# Patient Record
Sex: Female | Born: 1960 | Race: White | Hispanic: No | Marital: Married | Smoking: Never smoker
Health system: Southern US, Community
[De-identification: ages and names within clinical notes are randomized; demographics above are authoritative.]

## PROBLEM LIST (undated history)

## (undated) DIAGNOSIS — C439 Malignant melanoma of skin, unspecified: Secondary | ICD-10-CM

## (undated) DIAGNOSIS — N809 Endometriosis, unspecified: Secondary | ICD-10-CM

## (undated) DIAGNOSIS — J329 Chronic sinusitis, unspecified: Secondary | ICD-10-CM

## (undated) DIAGNOSIS — E559 Vitamin D deficiency, unspecified: Secondary | ICD-10-CM

## (undated) DIAGNOSIS — G43909 Migraine, unspecified, not intractable, without status migrainosus: Secondary | ICD-10-CM

## (undated) HISTORY — DX: Chronic sinusitis, unspecified: J32.9

## (undated) HISTORY — PX: SHOULDER ARTHROSCOPY W/ ROTATOR CUFF REPAIR: SHX2400

## (undated) HISTORY — PX: JOINT REPLACEMENT: SHX530

## (undated) HISTORY — DX: Vitamin D deficiency, unspecified: E55.9

## (undated) HISTORY — PX: DILATION AND CURETTAGE OF UTERUS: SHX78

## (undated) HISTORY — DX: Malignant melanoma of skin, unspecified: C43.9

## (undated) HISTORY — PX: OTHER SURGICAL HISTORY: SHX169

## (undated) HISTORY — PX: FINGER SURGERY: SHX640

---

## 1989-06-07 HISTORY — PX: TUBAL LIGATION: SHX77

## 2005-03-09 ENCOUNTER — Ambulatory Visit: Payer: Self-pay

## 2005-04-02 ENCOUNTER — Observation Stay: Payer: Self-pay | Admitting: Obstetrics and Gynecology

## 2005-04-23 ENCOUNTER — Ambulatory Visit: Payer: Self-pay | Admitting: Obstetrics and Gynecology

## 2005-06-07 HISTORY — PX: LAPAROSCOPY: SHX197

## 2005-11-29 ENCOUNTER — Inpatient Hospital Stay: Payer: Self-pay | Admitting: Obstetrics and Gynecology

## 2006-04-12 ENCOUNTER — Emergency Department: Payer: Self-pay | Admitting: Emergency Medicine

## 2006-04-19 IMAGING — CT CT ABD-PELV W/ CM
1 of 2 series · 16 of 32 positions shown, 20 images · non-contrast
Comparison: none

REASON FOR EXAM: Abdominal pain
COMMENTS:

[Series 2: abdomen · axial · 0.63mm/px · z∈[-497,-72]mm · 16 of 93 slices shown, 20 images]
[im 4/93  soft-tissue]
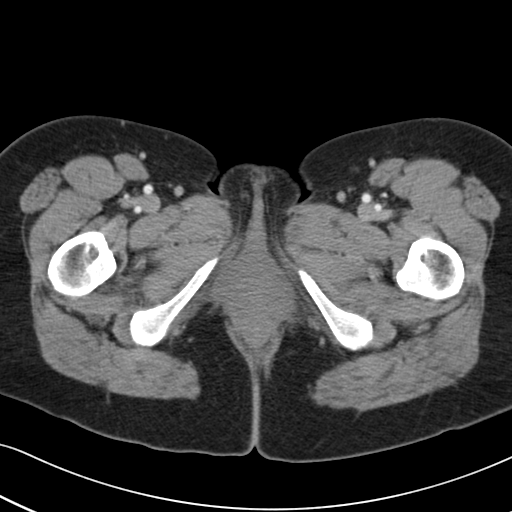
[im 4/93  bone]
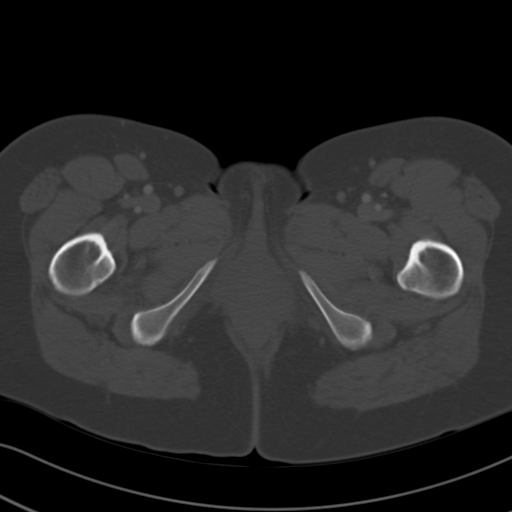
[im 12/93  soft-tissue]
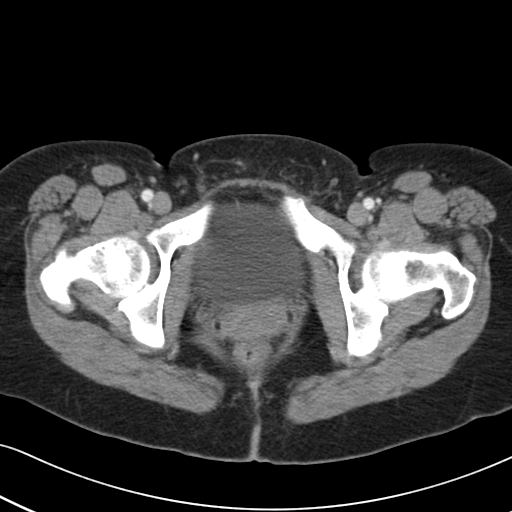
[im 20/93  soft-tissue]
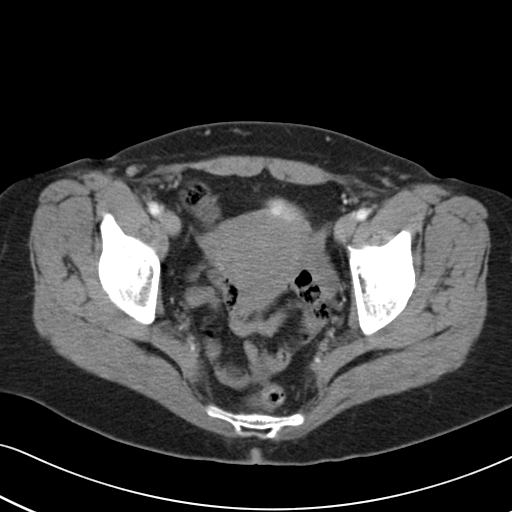
[im 24/93  soft-tissue]
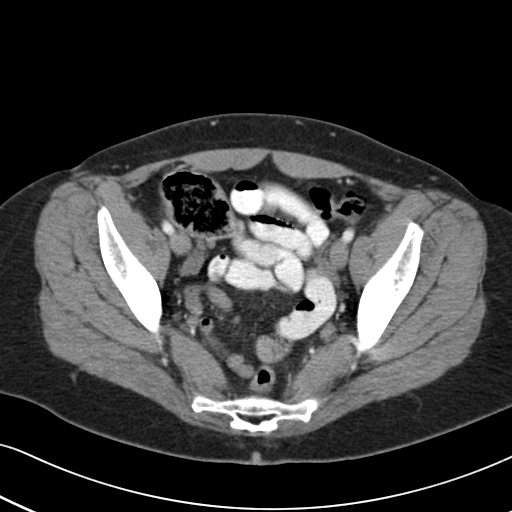
[im 31/93  soft-tissue]
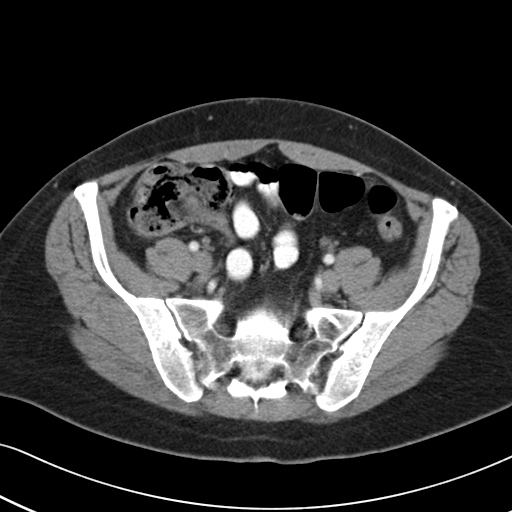
[im 39/93  soft-tissue]
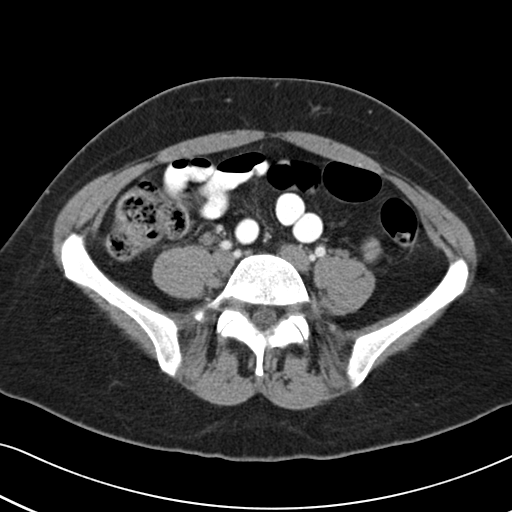
[im 43/93  soft-tissue]
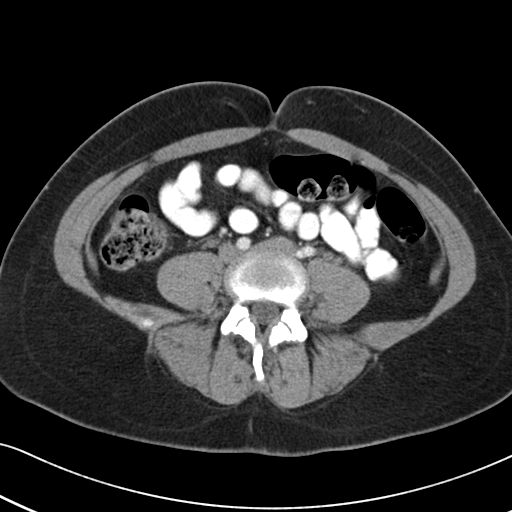
[im 50/93  soft-tissue]
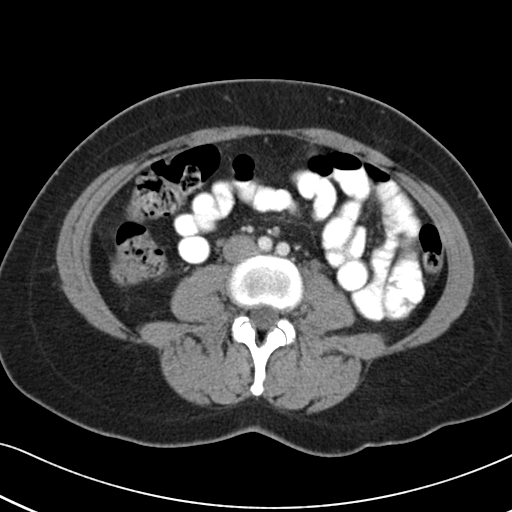
[im 54/93  soft-tissue]
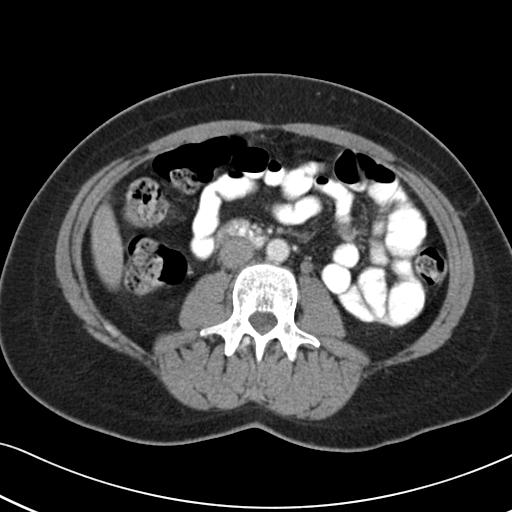
[im 54/93  bone]
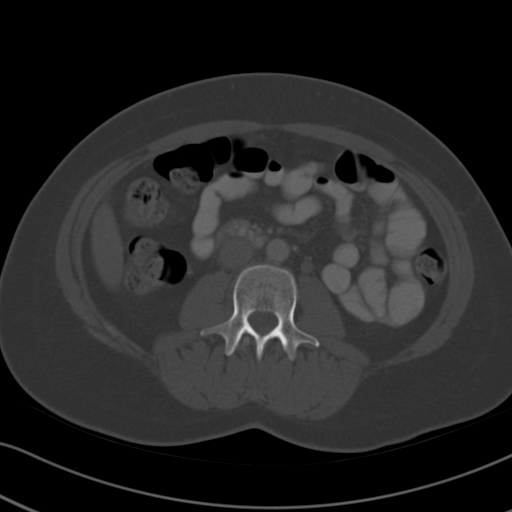
[im 62/93  soft-tissue]
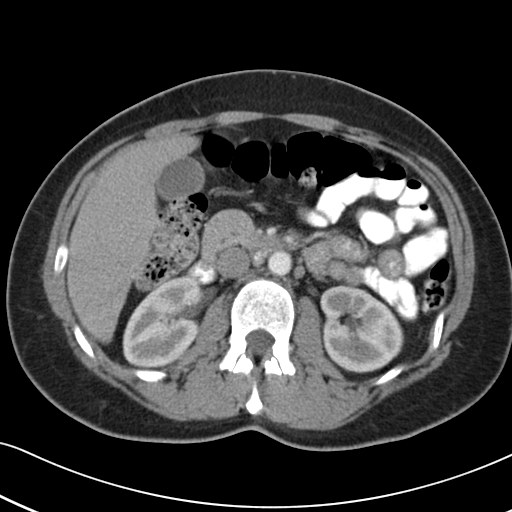
[im 70/93  soft-tissue]
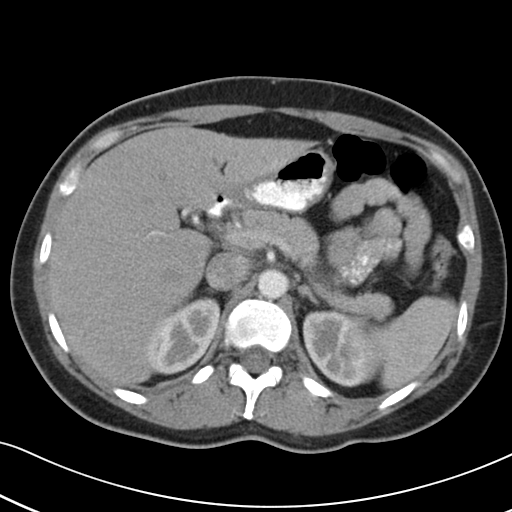
[im 73/93  soft-tissue]
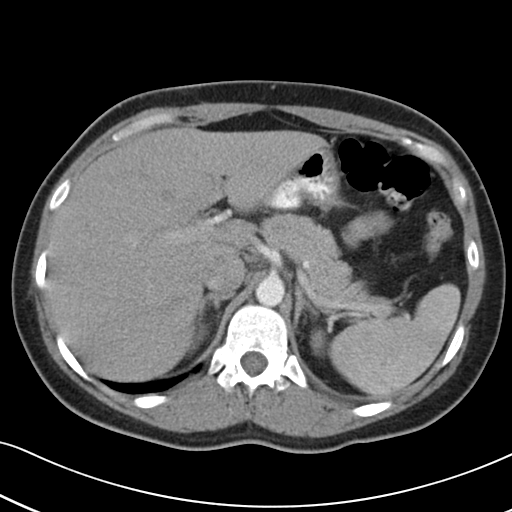
[im 77/93  lung]
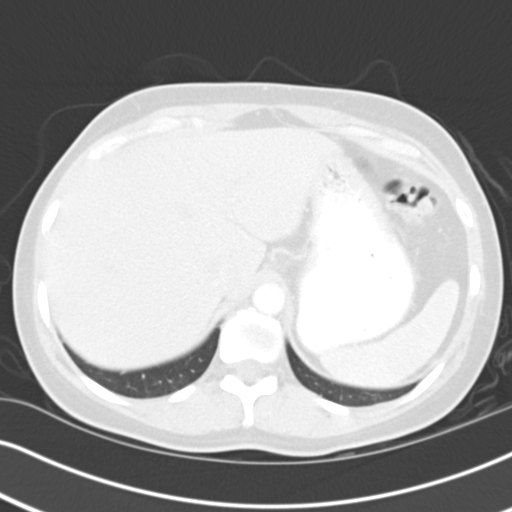
[im 81/93  soft-tissue]
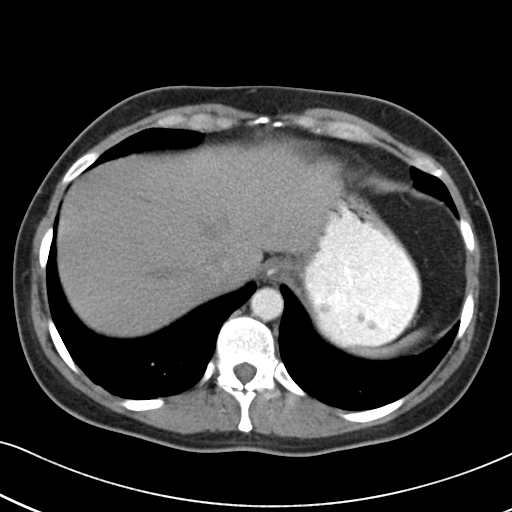
[im 81/93  lung]
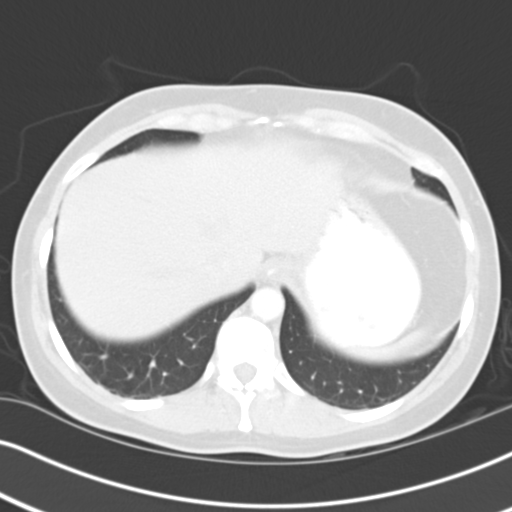
[im 85/93  lung]
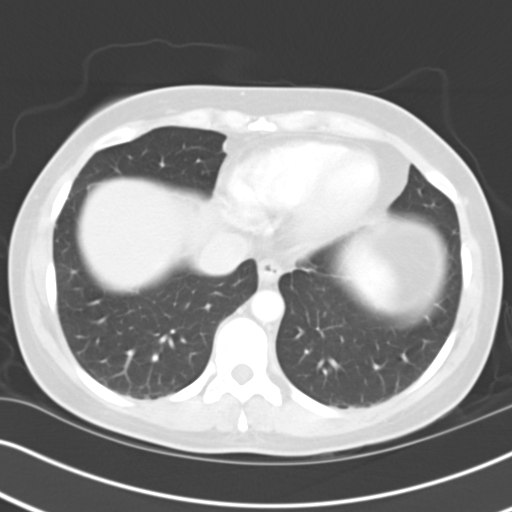
[im 89/93  soft-tissue]
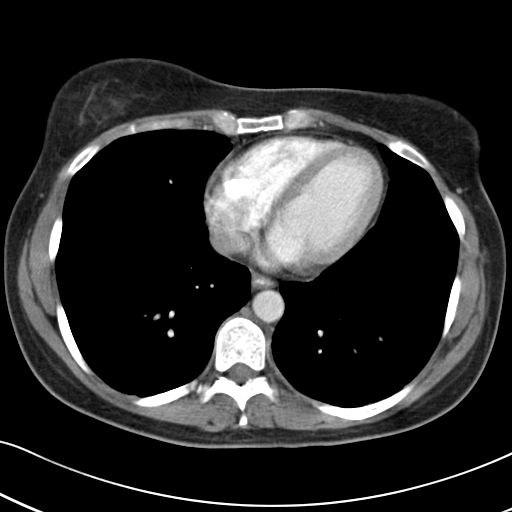
[im 89/93  lung]
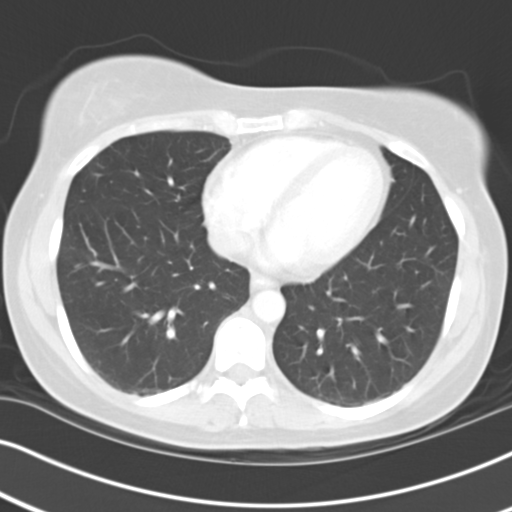

[16 of 32 positions shown; findings below may reference images not displayed]

PROCEDURE:     CT  - CT ABDOMEN / PELVIS  W  - April 02, 2005  [DATE]

RESULT:          Emergent abdominopelvic CT was performed for abdominal
pain.  The exam was originally read by the [HOSPITAL].

No renal or ureteral calculi are identified.  Pelvic phleboliths are noted.
No fluid in the pelvis or abdomen.  No infiltration of the fat is noted to
suggest appendicitis or diverticulitis.  Both kidneys excrete the contrast.
There is no hydronephrosis.  No major organ abnormality is seen.  The lung
bases appear clear with no effusions.
IMPRESSION: No significant abnormality is noted on the
abdominopelvic CT.

## 2006-05-04 ENCOUNTER — Ambulatory Visit: Payer: Self-pay | Admitting: Obstetrics and Gynecology

## 2006-05-25 ENCOUNTER — Ambulatory Visit: Payer: Self-pay | Admitting: Obstetrics and Gynecology

## 2006-06-07 HISTORY — PX: ABDOMINAL HYSTERECTOMY: SHX81

## 2007-06-21 ENCOUNTER — Ambulatory Visit: Payer: Self-pay

## 2007-09-09 ENCOUNTER — Inpatient Hospital Stay: Payer: Self-pay | Admitting: Internal Medicine

## 2008-06-21 ENCOUNTER — Ambulatory Visit: Payer: Self-pay | Admitting: Obstetrics and Gynecology

## 2009-06-23 ENCOUNTER — Ambulatory Visit: Payer: Self-pay

## 2010-05-14 ENCOUNTER — Emergency Department: Payer: Self-pay | Admitting: Emergency Medicine

## 2010-05-15 ENCOUNTER — Emergency Department: Payer: Self-pay | Admitting: Emergency Medicine

## 2010-07-14 ENCOUNTER — Ambulatory Visit: Payer: Self-pay

## 2011-07-20 ENCOUNTER — Ambulatory Visit: Payer: Self-pay

## 2011-10-04 ENCOUNTER — Emergency Department: Payer: Self-pay | Admitting: *Deleted

## 2011-10-04 LAB — COMPREHENSIVE METABOLIC PANEL
Albumin: 4.1 g/dL (ref 3.4–5.0)
Anion Gap: 9 (ref 7–16)
Bilirubin,Total: 0.9 mg/dL (ref 0.2–1.0)
Calcium, Total: 9.1 mg/dL (ref 8.5–10.1)
EGFR (African American): >60
Glucose: 95 mg/dL (ref 65–99)
Osmolality: 277 (ref 275–301)
Potassium: 4 mmol/L (ref 3.5–5.1)
SGOT(AST): 37 U/L (ref 15–37)
SGPT (ALT): 29 U/L
Total Protein: 8.2 g/dL (ref 6.4–8.2)

## 2011-10-04 LAB — URINALYSIS, COMPLETE
Bacteria: NONE SEEN
Bilirubin,UR: NEGATIVE
Blood: NEGATIVE
Glucose,UR: NEGATIVE mg/dL (ref 0–75)
Nitrite: NEGATIVE
Ph: 5 (ref 4.5–8.0)
Protein: NEGATIVE
RBC,UR: 1 /HPF (ref 0–5)
Specific Gravity: 1.024 (ref 1.003–1.030)
WBC UR: 2 /HPF (ref 0–5)

## 2011-10-04 LAB — CBC
HCT: 45.2 % (ref 35.0–47.0)
MCH: 30.1 pg (ref 26.0–34.0)
MCHC: 33.7 g/dL (ref 32.0–36.0)
Platelet: 324 10*3/uL (ref 150–440)
RBC: 5.08 10*6/uL (ref 3.80–5.20)
RDW: 12.7 % (ref 11.5–14.5)
WBC: 13.5 10*3/uL — ABNORMAL HIGH (ref 3.6–11.0)

## 2011-10-04 LAB — LIPASE, BLOOD: Lipase: 117 U/L (ref 73–393)

## 2012-05-22 ENCOUNTER — Emergency Department: Payer: Self-pay | Admitting: Emergency Medicine

## 2012-05-22 LAB — COMPREHENSIVE METABOLIC PANEL
Albumin: 3.7 g/dL (ref 3.4–5.0)
Alkaline Phosphatase: 128 U/L (ref 50–136)
Anion Gap: 10 (ref 7–16)
BUN: 17 mg/dL (ref 7–18)
Bilirubin,Total: 0.6 mg/dL (ref 0.2–1.0)
Calcium, Total: 9 mg/dL (ref 8.5–10.1)
Co2: 20 mmol/L — ABNORMAL LOW (ref 21–32)
EGFR (African American): >60
EGFR (Non-African Amer.): >60
Glucose: 103 mg/dL — ABNORMAL HIGH (ref 65–99)
Osmolality: 277 (ref 275–301)
Potassium: 3.9 mmol/L (ref 3.5–5.1)
SGOT(AST): 27 U/L (ref 15–37)
Sodium: 138 mmol/L (ref 136–145)
Total Protein: 8 g/dL (ref 6.4–8.2)

## 2012-05-22 LAB — URINALYSIS, COMPLETE
Bacteria: NONE SEEN
Bilirubin,UR: NEGATIVE
Glucose,UR: NEGATIVE mg/dL (ref 0–75)
Leukocyte Esterase: NEGATIVE
Nitrite: NEGATIVE
RBC,UR: <1 /HPF (ref 0–5)
Specific Gravity: 1.021 (ref 1.003–1.030)
Squamous Epithelial: 4
WBC UR: 1 /HPF (ref 0–5)

## 2012-05-22 LAB — CBC
HGB: 14.2 g/dL (ref 12.0–16.0)
MCH: 29.4 pg (ref 26.0–34.0)
MCV: 89 fL (ref 80–100)
Platelet: 369 10*3/uL (ref 150–440)
RBC: 4.85 10*6/uL (ref 3.80–5.20)
WBC: 13.8 10*3/uL — ABNORMAL HIGH (ref 3.6–11.0)

## 2014-04-10 ENCOUNTER — Ambulatory Visit: Payer: Self-pay | Admitting: Family Medicine

## 2014-04-26 ENCOUNTER — Ambulatory Visit: Payer: Self-pay | Admitting: Gastroenterology

## 2014-05-17 ENCOUNTER — Ambulatory Visit: Payer: Self-pay | Admitting: Urgent Care

## 2014-09-12 ENCOUNTER — Ambulatory Visit
Admit: 2014-09-12 | Disposition: A | Discharge status: Home / Self Care | Payer: Self-pay | Admitting: Obstetrics and Gynecology

## 2015-05-08 ENCOUNTER — Telehealth: Payer: Self-pay | Admitting: Obstetrics and Gynecology

## 2015-05-08 NOTE — Telephone Encounter (Signed)
Pt called and wanted to know if you could call her in a z-pack she said that you have done it before, she has a sinus infection, she uses [facility].

## 2015-05-09 ENCOUNTER — Other Ambulatory Visit: Payer: Self-pay | Admitting: Obstetrics and Gynecology

## 2015-05-09 MED ORDER — AZITHROMYCIN 250 MG PO TABS: ORAL_TABLET | ORAL | Status: DC

## 2015-05-09 NOTE — Telephone Encounter (Signed)
Please let her know it was sent in today 

## 2015-05-12 NOTE — Telephone Encounter (Signed)
Pt had gotten rx on Friday and is feeling much better.

## 2015-05-23 ENCOUNTER — Emergency Department
Admission: EM | Admit: 2015-05-23 | Discharge: 2015-05-24 | Disposition: A | Discharge status: Home / Self Care | Payer: 59 | Attending: Emergency Medicine | Admitting: Emergency Medicine

## 2015-05-23 ENCOUNTER — Emergency Department: Payer: 59

## 2015-05-23 ENCOUNTER — Encounter: Payer: Self-pay | Admitting: Emergency Medicine

## 2015-05-23 DIAGNOSIS — R1013 Epigastric pain: Secondary | ICD-10-CM | POA: Diagnosis not present

## 2015-05-23 DIAGNOSIS — R0602 Shortness of breath: Secondary | ICD-10-CM | POA: Diagnosis not present

## 2015-05-23 DIAGNOSIS — R079 Chest pain, unspecified: Secondary | ICD-10-CM | POA: Diagnosis present

## 2015-05-23 HISTORY — DX: Endometriosis, unspecified: N80.9

## 2015-05-23 HISTORY — DX: Migraine, unspecified, not intractable, without status migrainosus: G43.909

## 2015-05-23 LAB — CBC
HEMATOCRIT: 42.1 % (ref 35.0–47.0)
HEMOGLOBIN: 13.6 g/dL (ref 12.0–16.0)
MCH: 28.7 pg (ref 26.0–34.0)
MCHC: 32.4 g/dL (ref 32.0–36.0)
MCV: 88.6 fL (ref 80.0–100.0)
Platelets: 318 10*3/uL (ref 150–440)
RBC: 4.75 MIL/uL (ref 3.80–5.20)
RDW: 12.9 % (ref 11.5–14.5)
WBC: 9.3 10*3/uL (ref 3.6–11.0)

## 2015-05-23 NOTE — ED Notes (Signed)
Pt states at 2200 this pm began to experience burning to central chest that extended to throat and upper abd. Pt denies dizziness, states did feel shob.

## 2015-05-23 NOTE — ED Notes (Signed)
Pt assisted up to restroom

## 2015-05-24 ENCOUNTER — Emergency Department: Payer: 59

## 2015-05-24 LAB — COMPREHENSIVE METABOLIC PANEL
ALK PHOS: 112 U/L (ref 38–126)
ALT: 25 U/L (ref 14–54)
AST: 31 U/L (ref 15–41)
Albumin: 4.1 g/dL (ref 3.5–5.0)
Anion gap: 5 (ref 5–15)
BUN: 24 mg/dL — AB (ref 6–20)
CALCIUM: 9.1 mg/dL (ref 8.9–10.3)
CHLORIDE: 107 mmol/L (ref 101–111)
CO2: 27 mmol/L (ref 22–32)
CREATININE: 0.8 mg/dL (ref 0.44–1.00)
Glucose, Bld: 99 mg/dL (ref 65–99)
Potassium: 4.1 mmol/L (ref 3.5–5.1)
Sodium: 139 mmol/L (ref 135–145)
Total Bilirubin: 1.2 mg/dL (ref 0.3–1.2)
Total Protein: 7.5 g/dL (ref 6.5–8.1)

## 2015-05-24 LAB — LIPASE, BLOOD: Lipase: 32 U/L (ref 11–51)

## 2015-05-24 LAB — TROPONIN I: Troponin I: <0.03 ng/mL (ref <0.031)

## 2015-05-24 MED ORDER — SUCRALFATE 1 G PO TABS: 1.0000 g | ORAL_TABLET | Freq: Two times a day (BID) | ORAL | Status: DC

## 2015-05-24 NOTE — ED Notes (Signed)
Pt returned from ultrasound. Pt states pain improved, water provided with md consent.

## 2015-05-24 NOTE — ED Notes (Signed)
Repeat troponin drawn and sent to lab.

## 2015-05-24 NOTE — ED Provider Notes (Signed)
[facility] Provider Note  ____________________________________________  Time seen: Approximately 0017 AM  I have reviewed the triage vital signs and the nursing notes.   HISTORY  Chief Complaint Chest Pain    HPI Kristie Hall is a 54 y.o. female who comes into the hospital tonight with chest burning. The patient reports that the pain started 10 PM. She reports that she started hurting bad and burning from her stomach for her throat. The patient reports that she thinks this is due to a sick gallbladder. The patient has had intermittent epigastric pain on and off for the past year. Her stomach gets tightapproximate 10 minutes after eating and lasts for a short period of time. The patient reports that this is the first time that she's had the burning in her chest. The patient has had an HIDA scan done as well as an endoscopy and has been told that her gallbladder is fine but she feels that that may be the cause of her symptoms. The patient reports that currently her pain is gone but she did have some shortness of breath when she experienced the chest burning. She denies any sweats nausea or vomiting. The patient reports that she eats healthy and does not eat lots of greasy or fatty foods.   Past Medical History  Diagnosis Date  . Migraines   . Endometriosis     There are no active problems to display for this patient.   Past Surgical History  Procedure Laterality Date  . Endrometrial surgery      Current Outpatient Rx  Name  Route  Sig  Dispense  Refill  . sucralfate (CARAFATE) 1 G tablet   Oral   Take 1 tablet (1 g total) by mouth 2 (two) times daily.   20 tablet   0     Allergies Imitrex  History reviewed. No pertinent family history.  Social History Social History  Substance Use Topics  . Smoking status: Never Smoker   . Smokeless tobacco: Never Used  . Alcohol Use: Yes    Review of Systems Constitutional: No  fever/chills Eyes: No visual changes. ENT: No sore throat. Cardiovascular:  chest pain. Respiratory:  shortness of breath. Gastrointestinal: No abdominal pain.  No nausea, no vomiting.  No diarrhea.  No constipation. Genitourinary: Negative for dysuria. Musculoskeletal: Negative for back pain. Skin: Negative for rash. Neurological: Negative for headaches, focal weakness or numbness.  10-point ROS otherwise negative.  ____________________________________________   PHYSICAL EXAM:  VITAL SIGNS: ED Triage Vitals  Enc Vitals Group     BP 05/23/15 2318 143/87 mmHg     Pulse Rate 05/23/15 2318 74     Resp 05/23/15 2318 20     Temp 05/23/15 2318 98.2 F (36.8 C)     Temp src --      SpO2 05/23/15 2318 100 %     Weight 05/23/15 2318 167 lb (75.751 kg)     Height 05/23/15 2318 5\' 7"  (1.702 m)     Head Cir --      Peak Flow --      Pain Score 05/23/15 2319 1     Pain Loc --      Pain Edu? --      Excl. in [place]? --     Constitutional: Alert and oriented. Well appearing and in no acute distress. Eyes: Conjunctivae are normal. PERRL. EOMI. Head: Atraumatic. Nose: No congestion/rhinnorhea. Mouth/Throat: Mucous membranes are moist.  Oropharynx non-erythematous. Cardiovascular: Normal rate, regular  rhythm. Grossly normal heart sounds.  Good peripheral circulation. Respiratory: Normal respiratory effort.  No retractions. Lungs CTAB. Gastrointestinal: Soft and nontender. No distention. Positive bowel sounds Musculoskeletal: No lower extremity tenderness nor edema.   Neurologic:  Normal speech and language.  Skin:  Skin is warm, dry and intact. No rash noted. Psychiatric: Mood and affect are normal.  ____________________________________________   LABS (all labs ordered are listed, but only abnormal results are displayed)  Labs Reviewed  COMPREHENSIVE METABOLIC PANEL - Abnormal; Notable for the following:    BUN 24 (*)    All other components within normal limits  CBC  TROPONIN  I  LIPASE, BLOOD  TROPONIN I   ____________________________________________  EKG  ED ECG REPORT I, Loney Hering, the attending physician, personally viewed and interpreted this ECG.   Date: 05/23/2015  EKG Time: 2320  Rate: 73  Rhythm: normal sinus rhythm  Axis: Normal  Intervals:none  ST&T Change: None  ____________________________________________  RADIOLOGY  Chest x-ray: Mild atelectasis within the lingula, no other active cardiopulmonary disease Ultrasound: Normal sonographic appearance of the gallbladder and biliary tree.  ____________________________________________   PROCEDURES  Procedure(s) performed: None  Critical Care performed: No  ____________________________________________   INITIAL IMPRESSION / ASSESSMENT AND PLAN / ED COURSE  Pertinent labs & imaging results that were available during my care of the patient were reviewed by me and considered in my medical decision making (see chart for details).  This is a 54 year old female who comes in today with some chest burning that started around 10 PM. The patient reports that she feels as though this is her gallbladder although she's had her gallbladder evaluated in the past. At this time the patient's symptoms have resolved. I sent the patient for an ultrasound and it does not show any gallbladder disease. The patient's chest x-ray shows atelectasis with no distinctive pneumonia and her blood work is unremarkable. I informed the patient that should she want her gallbladder removed she should consult a surgeon who can make that decision. At this time we are waiting the results of the second troponin and will disposition the patient at that time.  ----------------------------------------- 3:48 AM on 05/24/2015 -----------------------------------------  The patient's repeat troponin is unremarkable. I will send the patient home with some Carafate and have her follow-up with GI as well as surgery to  determine if she needs a further gallbladder evaluation. Patient at this time is pain free and in no acute distress. ____________________________________________   FINAL CLINICAL IMPRESSION(S) / ED DIAGNOSES  Final diagnoses:  Epigastric pain  Chest pain, unspecified chest pain type      Loney Hering, MD 05/24/15 [phone]

## 2015-05-24 NOTE — Discharge Instructions (Signed)
Nonspecific Chest Pain  Chest pain can be caused by many different conditions. There is always a chance that your pain could be related to something serious, such as a heart attack or a blood clot in your lungs. Chest pain can also be caused by conditions that are not life-threatening. If you have chest pain, it is very important to follow up with your health care provider. CAUSES  Chest pain can be caused by:  Heartburn.  Pneumonia or bronchitis.  Anxiety or stress.  Inflammation around your heart (pericarditis) or lung (pleuritis or pleurisy).  A blood clot in your lung.  A collapsed lung (pneumothorax). It can develop suddenly on its own (spontaneous pneumothorax) or from trauma to the chest.  Shingles infection (varicella-zoster virus).  Heart attack.  Damage to the bones, muscles, and cartilage that make up your chest wall. This can include:  Bruised bones due to injury.  Strained muscles or cartilage due to frequent or repeated coughing or overwork.  Fracture to one or more ribs.  Sore cartilage due to inflammation (costochondritis). RISK FACTORS  Risk factors for chest pain may include:  Activities that increase your risk for trauma or injury to your chest.  Respiratory infections or conditions that cause frequent coughing.  Medical conditions or overeating that can cause heartburn.  Heart disease or family history of heart disease.  Conditions or health behaviors that increase your risk of developing a blood clot.  Having had chicken pox (varicella zoster). SIGNS AND SYMPTOMS Chest pain can feel like:  Burning or tingling on the surface of your chest or deep in your chest.  Crushing, pressure, aching, or squeezing pain.  Dull or sharp pain that is worse when you move, cough, or take a deep breath.  Pain that is also felt in your back, neck, shoulder, or arm, or pain that spreads to any of these areas. Your chest pain may come and go, or it may stay  constant. DIAGNOSIS Lab tests or other studies may be needed to find the cause of your pain. Your health care provider may have you take a test called an ambulatory ECG (electrocardiogram). An ECG records your heartbeat patterns at the time the test is performed. You may also have other tests, such as:  Transthoracic echocardiogram (TTE). During echocardiography, sound waves are used to create a picture of all of the heart structures and to look at how blood flows through your heart.  Transesophageal echocardiogram (TEE).This is a more advanced imaging test that obtains images from inside your body. It allows your health care provider to see your heart in finer detail.  Cardiac monitoring. This allows your health care provider to monitor your heart rate and rhythm in real time.  Holter monitor. This is a portable device that records your heartbeat and can help to diagnose abnormal heartbeats. It allows your health care provider to track your heart activity for several days, if needed.  Stress tests. These can be done through exercise or by taking medicine that makes your heart beat more quickly.  Blood tests.  Imaging tests. TREATMENT  Your treatment depends on what is causing your chest pain. Treatment may include:  Medicines. These may include:  Acid blockers for heartburn.  Anti-inflammatory medicine.  Pain medicine for inflammatory conditions.  Antibiotic medicine, if an infection is present.  Medicines to dissolve blood clots.  Medicines to treat coronary artery disease.  Supportive care for conditions that do not require medicines. This may include:  Resting.  Applying heat  or cold packs to injured areas.  Limiting activities until pain decreases. HOME CARE INSTRUCTIONS  If you were prescribed an antibiotic medicine, finish it all even if you start to feel better.  Avoid any activities that bring on chest pain.  Do not use any tobacco products, including  cigarettes, chewing tobacco, or electronic cigarettes. If you need help quitting, ask your health care provider.  Do not drink alcohol.  Take medicines only as directed by your health care provider.  Keep all follow-up visits as directed by your health care provider. This is important. This includes any further testing if your chest pain does not go away.  If heartburn is the cause for your chest pain, you may be told to keep your head raised (elevated) while sleeping. This reduces the chance that acid will go from your stomach into your esophagus.  Make lifestyle changes as directed by your health care provider. These may include:  Getting regular exercise. Ask your health care provider to suggest some activities that are safe for you.  Eating a heart-healthy diet. A registered dietitian can help you to learn healthy eating options.  Maintaining a healthy weight.  Managing diabetes, if necessary.  Reducing stress. SEEK MEDICAL CARE IF:  Your chest pain does not go away after treatment.  You have a rash with blisters on your chest.  You have a fever. SEEK IMMEDIATE MEDICAL CARE IF:   Your chest pain is worse.  You have an increasing cough, or you cough up blood.  You have severe abdominal pain.  You have severe weakness.  You faint.  You have chills.  You have sudden, unexplained chest discomfort.  You have sudden, unexplained discomfort in your arms, back, neck, or jaw.  You have shortness of breath at any time.  You suddenly start to sweat, or your skin gets clammy.  You feel nauseous or you vomit.  You suddenly feel light-headed or dizzy.  Your heart begins to beat quickly, or it feels like it is skipping beats. These symptoms may represent a serious problem that is an emergency. Do not wait to see if the symptoms will go away. Get medical help right away. Call your local emergency services (911 in the [place] Do not drive yourself to the hospital.   This  information is not intended to replace advice given to you by your health care provider. Make sure you discuss any questions you have with your health care provider.   Document Released: 03/03/2005 Document Revised: 06/14/2014 Document Reviewed: 12/28/2013 Elsevier Interactive Patient Education 2016 [employer]  Abdominal Pain, Adult Many things can cause abdominal pain. Usually, abdominal pain is not caused by a disease and will improve without treatment. It can often be observed and treated at home. Your health care provider will do a physical exam and possibly order blood tests and X-rays to help determine the seriousness of your pain. However, in many cases, more time must pass before a clear cause of the pain can be found. Before that point, your health care provider may not know if you need more testing or further treatment. HOME CARE INSTRUCTIONS Monitor your abdominal pain for any changes. The following actions may help to alleviate any discomfort you are experiencing:  Only take over-the-counter or prescription medicines as directed by your health care provider.  Do not take laxatives unless directed to do so by your health care provider.  Try a clear liquid diet (broth, tea, or water) as directed by your health care  provider. Slowly move to a bland diet as tolerated. SEEK MEDICAL CARE IF:  You have unexplained abdominal pain.  You have abdominal pain associated with nausea or diarrhea.  You have pain when you urinate or have a bowel movement.  You experience abdominal pain that wakes you in the night.  You have abdominal pain that is worsened or improved by eating food.  You have abdominal pain that is worsened with eating fatty foods.  You have a fever. SEEK IMMEDIATE MEDICAL CARE IF:  Your pain does not go away within 2 hours.  You keep throwing up (vomiting).  Your pain is felt only in portions of the abdomen, such as the right side or the left lower portion of the  abdomen.  You pass bloody or black tarry stools. MAKE SURE YOU:  Understand these instructions.  Will watch your condition.  Will get help right away if you are not doing well or get worse.   This information is not intended to replace advice given to you by your health care provider. Make sure you discuss any questions you have with your health care provider.   Document Released: 03/03/2005 Document Revised: 02/12/2015 Document Reviewed: 01/31/2013 Elsevier Interactive Patient Education [employer].

## 2015-05-24 NOTE — ED Notes (Signed)
Explanation of repeat troponin and time frame provided to pt and spouse. Pt verbalizes understanding. Extra pillow and po fluids provided to pt's spouse.

## 2015-05-24 NOTE — ED Notes (Signed)
Patient transported to Ultrasound 

## 2015-08-18 ENCOUNTER — Encounter: Payer: Self-pay | Admitting: *Deleted

## 2015-08-20 ENCOUNTER — Encounter: Payer: Self-pay | Admitting: Obstetrics and Gynecology

## 2015-08-20 ENCOUNTER — Other Ambulatory Visit: Payer: Self-pay | Admitting: Obstetrics and Gynecology

## 2015-08-20 ENCOUNTER — Telehealth: Payer: Self-pay | Admitting: Obstetrics and Gynecology

## 2015-08-20 DIAGNOSIS — Z1231 Encounter for screening mammogram for malignant neoplasm of breast: Secondary | ICD-10-CM

## 2015-08-20 NOTE — Telephone Encounter (Signed)
Kristie Hall has a virus and had to cancel her AE, i couldn't reschedule until may, she wanted to know if you could go ahead and put her order in for her mammo so she can have that done when she comes? Let me know and ill call her.

## 2015-08-20 NOTE — Telephone Encounter (Signed)
Order placed

## 2015-09-19 DIAGNOSIS — M8550 Aneurysmal bone cyst, unspecified site: Secondary | ICD-10-CM | POA: Insufficient documentation

## 2015-09-19 DIAGNOSIS — M5459 Other low back pain: Secondary | ICD-10-CM | POA: Insufficient documentation

## 2015-09-19 DIAGNOSIS — M6530 Trigger finger, unspecified finger: Secondary | ICD-10-CM | POA: Insufficient documentation

## 2015-10-07 ENCOUNTER — Ambulatory Visit
Admission: RE | Admit: 2015-10-07 | Discharge: 2015-10-07 | Disposition: A | Discharge status: Home / Self Care | Payer: 59 | Source: Ambulatory Visit | Attending: Obstetrics and Gynecology | Admitting: Obstetrics and Gynecology

## 2015-10-07 ENCOUNTER — Ambulatory Visit (INDEPENDENT_AMBULATORY_CARE_PROVIDER_SITE_OTHER): Payer: 59 | Admitting: Obstetrics and Gynecology

## 2015-10-07 ENCOUNTER — Encounter: Payer: Self-pay | Admitting: Obstetrics and Gynecology

## 2015-10-07 VITALS — BP 113/78 | HR 79 | Ht 67.0 in | Wt 158.5 lb

## 2015-10-07 DIAGNOSIS — Z1231 Encounter for screening mammogram for malignant neoplasm of breast: Secondary | ICD-10-CM

## 2015-10-07 DIAGNOSIS — E559 Vitamin D deficiency, unspecified: Secondary | ICD-10-CM

## 2015-10-07 DIAGNOSIS — Z01419 Encounter for gynecological examination (general) (routine) without abnormal findings: Secondary | ICD-10-CM | POA: Diagnosis not present

## 2015-10-07 DIAGNOSIS — G479 Sleep disorder, unspecified: Secondary | ICD-10-CM

## 2015-10-07 DIAGNOSIS — G4720 Circadian rhythm sleep disorder, unspecified type: Secondary | ICD-10-CM

## 2015-10-07 MED ORDER — ZOLPIDEM TARTRATE ER 6.25 MG PO TBCR: 6.2500 mg | EXTENDED_RELEASE_TABLET | Freq: Every evening | ORAL | Status: DC | PRN

## 2015-10-07 NOTE — Patient Instructions (Addendum)
Place annual gynecologic exam patient instructions here.  Thank you for enrolling in [facility]. Please follow the instructions below to securely access your online medical record. MyChart allows you to send messages to your doctor, view your test results, manage appointments, and more.   How Do I Sign Up? 1. In your Internet browser, go to [employer] and enter [email or web address]. 2. Click on the Sign Up Now link in the Sign In box. You will see the New Member Sign Up page. 3. Enter your MyChart Access Code exactly as it appears below. You will not need to use this code after you've completed the sign-up process. If you do not sign up before the expiration date, you must request a new code.  MyChart Access Code: [email or web address] Expires: 10/19/2015  8:13 AM  4. Enter your Social Security Number ([id number]) and Date of Birth (mm/dd/yyyy) as indicated and click Submit. You will be taken to the next sign-up page. 5. Create a MyChart ID. This will be your MyChart login ID and cannot be changed, so think of one that is secure and easy to remember. 6. Create a MyChart password. You can change your password at any time. 7. Enter your Password Reset Question and Answer. This can be used at a later time if you forget your password.  8. Enter your e-mail address. You will receive e-mail notification when new information is available in [place]. 9. Click Sign Up. You can now view your medical record.   Additional Information Remember, MyChart is NOT to be used for urgent needs. For medical emergencies, dial 911.   Zolpidem extended-release tablets What is this medicine? ZOLPIDEM (zole PI dem) is used to treat insomnia. This medicine helps you to fall asleep and sleep through the night. This medicine may be used for other purposes; ask your health care provider or pharmacist if you have questions. What should I tell my health care provider before I take this medicine? They need to  know if you have any of these conditions: -depression -history of drug abuse or addiction -if you often drink alcohol -liver disease -lung or breathing disease -myasthenia gravis -sleep apnea -suicidal thoughts, plans, or attempt; a previous suicide attempt by you or a family member -an unusual or allergic reaction to zolpidem, other medicines, foods, dyes, or preservatives -pregnant or trying to get pregnant -breast-feeding How should I use this medicine? Take this medicine by mouth with a glass of water. Follow the directions on the prescription label. Do not crush, split, or chew the tablet before swallowing. It is better to take this medicine on an empty stomach and only when you are ready for bed. Do not take your medicine more often than directed. If you have been taking this medicine for several weeks and suddenly stop taking it, you may get unpleasant withdrawal symptoms. Your doctor or health care professional may want to gradually reduce the dose. Do not stop taking this medicine on your own. Always follow your doctor or health care professional's advice. A special MedGuide will be given to you by the pharmacist with each prescription and refill. Be sure to read this information carefully each time. Talk to your pediatrician regarding the use of this medicine in children. Special care may be needed. Overdosage: If you think you have taken too much of this medicine contact a poison control center or emergency room at once. NOTE: This medicine is only for you. Do not share this medicine with others. What if I miss a dose?  This does not apply. This medicine should only be taken immediately before going to sleep. Do not take double or extra doses. What may interact with this medicine? -alcohol -antihistamines for allergy, cough and cold -certain medicines for anxiety or sleep -certain medicines for depression, like amitriptyline, fluoxetine, sertraline -certain medicines for fungal  infections like ketoconazole and itraconazole -certain medicines for seizures like phenobarbital, primidone -ciprofloxacin -dietary supplements for sleep, like valerian or kava kava -general anesthetics like halothane, isoflurane, methoxyflurane, propofol -local anesthetics like lidocaine, pramoxine, tetracaine -medicines that relax muscles for surgery -narcotic medicines for pain -phenothiazines like chlorpromazine, mesoridazine, prochlorperazine, thioridazine -rifampin This list may not describe all possible interactions. Give your health care provider a list of all the medicines, herbs, non-prescription drugs, or dietary supplements you use. Also tell them if you smoke, drink alcohol, or use illegal drugs. Some items may interact with your medicine. What should I watch for while using this medicine? Visit your doctor or health care professional for regular checks on your progress. Keep a regular sleep schedule by going to bed at about the same time each night. Avoid caffeine-containing drinks in the evening hours. When sleep medicines are used every night for more than a few weeks, they may stop working. Talk to your doctor if your insomnia worsens or is not better within 7 to 10 days. After taking this medicine for sleep, you may get up out of bed while not being fully awake and do an activity that you do not know you are doing. The next morning, you may have no memory of the event. Activities such as driving a car ("sleep-driving"), making and eating food, talking on the phone, sexual activity, and sleep-walking have been reported. Call your doctor right away if you find out you have done any of these activities. Do not take this medicine if you have used alcohol that evening or before bed or taken another medicine for sleep since your risk of doing these sleep-related activities will be increased. Do not take this medicine unless you are able to stay in bed for a full night (7 to 8 hours) before  you must be active again. Do not drive, use machinery, or do anything that needs mental alertness the day after you take this medicine. You may have a decrease in mental alertness the day after use, even if you feel that you are fully awake. Tell your doctor if you will need to perform activities requiring full alertness, such as driving, the next day. Do not stand or sit up quickly, especially if you are an older patient. This reduces the risk of dizzy or fainting spells. If you or your family notice any changes in your moods or behavior, such as new or worsening depression, thoughts of harming yourself, anxiety, other unusual or disturbing thoughts, or memory loss, call your doctor right away. After you stop taking this medicine, you may have trouble falling asleep. This is called rebound insomnia. This problem usually goes away on its own after 1 or 2 nights. What side effects may I notice from receiving this medicine? Side effects that you should report to your doctor or health care professional as soon as possible: -allergic reactions like skin rash, itching or hives, swelling of the face, lips, or tongue -breathing problems -changes in vision -confusion -depressed mood or other changes in moods or emotions -feeling faint or lightheaded, falls -hallucinations -loss of balance or coordination -loss of memory -restlessness, excitability, or feelings of anxiety or agitation -suicidal thoughts -  unusual activities while asleep like driving, eating, making phone calls, or sexual activity Side effects that usually do not require medical attention (report to your doctor or health care professional if they continue or are bothersome): -dizziness -drowsiness the day after you take this medicine -headache This list may not describe all possible side effects. Call your doctor for medical advice about side effects. You may report side effects to FDA at 1-800-FDA-1088. Where should I keep my  medicine? Keep out of the reach of children. This medicine can be abused. Keep your medicine in a safe place to protect it from theft. Do not share this medicine with anyone. Selling or giving away this medicine is dangerous and against the law. This medicine may cause accidental overdose and death if taken by other adults, children, or pets. Mix any unused medicine with a substance like cat litter or coffee grounds. Then throw the medicine away in a sealed container like a sealed bag or a coffee can with a lid. Do not use the medicine after the expiration date. Store at controlled room temperature between 15 and 25 degrees C (59 and 77 degrees F). NOTE: This sheet is a summary. It may not cover all possible information. If you have questions about this medicine, talk to your doctor, pharmacist, or health care provider.    2016, Elsevier/Gold Standard. (2015-01-27 16:48:57)

## 2015-10-07 NOTE — Progress Notes (Signed)
Subjective:   Kristie Hall is a 55 y.o. No obstetric history on file. Caucasian female here for a routine well-woman exam.  No LMP recorded. Patient has had a hysterectomy.    Current complaints: no sensation vaginal since last delivery and hysterectomy PCP: me       does desire labs  Social History: Sexual: heterosexual Marital Status: married Living situation: with family Occupation: Freight forwarder at [employer] Tobacco/alcohol: no tobacco use Illicit drugs: no history of illicit drug use  The following portions of the patient's history were reviewed and updated as appropriate: allergies, current medications, past family history, past medical history, past social history, past surgical history and problem list.  Past Medical History Past Medical History  Diagnosis Date  . Migraines   . Endometriosis   . Melanoma ([place])   . Sinusitis   . Vitamin D deficiency     Past Surgical History Past Surgical History  Procedure Laterality Date  . Endrometrial surgery    . Abdominal hysterectomy  2008  . Laparoscopy  2007  . Tubal ligation  1991    Gynecologic History No obstetric history on file.  No LMP recorded. Patient has had a hysterectomy. Contraception: status post hysterectomy Last Pap: 2015. Results were: normal Last mammogram: 2017. Results were: normal   Obstetric History OB History  No data available    Current Medications Current Outpatient Prescriptions on File Prior to Visit  Medication Sig Dispense Refill  . sucralfate (CARAFATE) 1 G tablet Take 1 tablet (1 g total) by mouth 2 (two) times daily. (Patient not taking: Reported on 10/07/2015) 20 tablet 0   No current facility-administered medications on file prior to visit.    Review of Systems Patient denies any headaches, blurred vision, shortness of breath, chest pain, abdominal pain, problems with bowel movements, urination, or intercourse.  Objective:  BP 113/78 mmHg  Pulse 79  Ht 5\' 7"  (1.702 m)  Wt  158 lb 8 oz (71.895 kg)  BMI 24.82 kg/m2 Physical Exam  General:  Well developed, well nourished, no acute distress. She is alert and oriented x3. Skin:  Warm and dry Neck:  Midline trachea, no thyromegaly or nodules Cardiovascular: Regular rate and rhythm, no murmur heard Lungs:  Effort normal, all lung fields clear to auscultation bilaterally Breasts:  No dominant palpable mass, retraction, or nipple discharge Abdomen:  Soft, non tender, no hepatosplenomegaly or masses Pelvic:  External genitalia is normal in appearance.  The vagina is normal in appearance. The cervix is bulbous, no CMT.  Thin prep pap is not done  Uterus is surgically absent.  No adnexal masses or tenderness noted. Extremities:  No swelling or varicosities noted Psych:  She has a normal mood and affect  Assessment:   Healthy well-woman exam Sleep pattern disturbance Vitamin d deficiency  Plan:  Labs obtained RX for Panaca given to try,  F/U 1 year for AE, or sooner if needed Mammogram done today  Melody Rockney Ghee, CNM

## 2015-10-08 LAB — COMPREHENSIVE METABOLIC PANEL
ALBUMIN: 4.1 g/dL (ref 3.5–5.5)
ALK PHOS: 132 IU/L — AB (ref 39–117)
ALT: 27 IU/L (ref 0–32)
AST: 24 IU/L (ref 0–40)
Albumin/Globulin Ratio: 1.3 (ref 1.2–2.2)
BUN/Creatinine Ratio: 17 (ref 9–23)
BUN: 11 mg/dL (ref 6–24)
Bilirubin Total: 0.6 mg/dL (ref 0.0–1.2)
CALCIUM: 9.2 mg/dL (ref 8.7–10.2)
CO2: 23 mmol/L (ref 18–29)
CREATININE: 0.63 mg/dL (ref 0.57–1.00)
Chloride: 100 mmol/L (ref 96–106)
GFR calc Af Amer: 118 mL/min/{1.73_m2} (ref >59)
GFR calc non Af Amer: 102 mL/min/{1.73_m2} (ref >59)
GLUCOSE: 85 mg/dL (ref 65–99)
Globulin, Total: 3.2 g/dL (ref 1.5–4.5)
Potassium: 4.1 mmol/L (ref 3.5–5.2)
Sodium: 141 mmol/L (ref 134–144)
Total Protein: 7.3 g/dL (ref 6.0–8.5)

## 2015-10-08 LAB — LIPID PANEL
Chol/HDL Ratio: 5.3 ratio units — ABNORMAL HIGH (ref 0.0–4.4)
Cholesterol, Total: 230 mg/dL — ABNORMAL HIGH (ref 100–199)
HDL: 43 mg/dL (ref >39)
LDL Calculated: 151 mg/dL — ABNORMAL HIGH (ref 0–99)
Triglycerides: 182 mg/dL — ABNORMAL HIGH (ref 0–149)
VLDL CHOLESTEROL CAL: 36 mg/dL (ref 5–40)

## 2015-10-08 LAB — VITAMIN D 25 HYDROXY (VIT D DEFICIENCY, FRACTURES): VIT D 25 HYDROXY: 39.8 ng/mL (ref 30.0–100.0)

## 2015-11-07 ENCOUNTER — Telehealth: Payer: Self-pay | Admitting: *Deleted

## 2015-11-07 NOTE — Telephone Encounter (Signed)
-----   Message from Joylene Igo, [place] sent at 10/14/2015  4:30 PM EDT ----- Please let her know vit D and comprehensive panel look good, but cholesterol is not good at all. Needs to continue to work on low chol, regular exercise and weight loss.

## 2015-11-07 NOTE — Telephone Encounter (Signed)
Notified pt about lab results

## 2016-07-05 ENCOUNTER — Other Ambulatory Visit: Payer: Self-pay | Admitting: Obstetrics and Gynecology

## 2016-07-05 DIAGNOSIS — Z1231 Encounter for screening mammogram for malignant neoplasm of breast: Secondary | ICD-10-CM

## 2016-10-07 ENCOUNTER — Ambulatory Visit: Payer: 59

## 2016-10-07 ENCOUNTER — Encounter: Payer: 59 | Admitting: Obstetrics and Gynecology

## 2016-12-09 ENCOUNTER — Ambulatory Visit: Payer: 59

## 2016-12-09 ENCOUNTER — Encounter: Payer: 59 | Admitting: Obstetrics and Gynecology

## 2017-01-07 ENCOUNTER — Other Ambulatory Visit: Payer: Self-pay | Admitting: Obstetrics and Gynecology

## 2017-01-07 ENCOUNTER — Ambulatory Visit
Admission: RE | Admit: 2017-01-07 | Discharge: 2017-01-07 | Disposition: A | Discharge status: Home / Self Care | Payer: 59 | Source: Ambulatory Visit | Attending: Obstetrics and Gynecology | Admitting: Obstetrics and Gynecology

## 2017-01-07 ENCOUNTER — Encounter: Payer: 59 | Admitting: Obstetrics and Gynecology

## 2017-01-07 DIAGNOSIS — R928 Other abnormal and inconclusive findings on diagnostic imaging of breast: Secondary | ICD-10-CM

## 2017-01-07 DIAGNOSIS — Z1231 Encounter for screening mammogram for malignant neoplasm of breast: Secondary | ICD-10-CM

## 2017-01-25 ENCOUNTER — Other Ambulatory Visit: Payer: Self-pay | Admitting: Obstetrics and Gynecology

## 2017-01-25 ENCOUNTER — Ambulatory Visit
Admission: RE | Admit: 2017-01-25 | Discharge: 2017-01-25 | Disposition: A | Discharge status: Home / Self Care | Payer: 59 | Source: Ambulatory Visit | Attending: Obstetrics and Gynecology | Admitting: Obstetrics and Gynecology

## 2017-01-25 DIAGNOSIS — R928 Other abnormal and inconclusive findings on diagnostic imaging of breast: Secondary | ICD-10-CM

## 2017-02-01 ENCOUNTER — Encounter: Payer: 59 | Admitting: Obstetrics and Gynecology

## 2017-04-01 ENCOUNTER — Ambulatory Visit (INDEPENDENT_AMBULATORY_CARE_PROVIDER_SITE_OTHER): Payer: 59 | Admitting: Certified Nurse Midwife

## 2017-04-01 ENCOUNTER — Encounter: Payer: Self-pay | Admitting: Certified Nurse Midwife

## 2017-04-01 VITALS — BP 101/68 | HR 77 | Ht 67.0 in | Wt 163.4 lb

## 2017-04-01 DIAGNOSIS — Z01419 Encounter for gynecological examination (general) (routine) without abnormal findings: Secondary | ICD-10-CM

## 2017-04-01 NOTE — Progress Notes (Signed)
ANNUAL PREVENTATIVE CARE GYN  ENCOUNTER NOTE  Subjective:       Kristie Hall is a 56 y.o. [phone] female here for a routine annual gynecologic exam.  Current complaints: 1.  Painful IC  2. Vaginal discharge      Gynecologic History No LMP recorded. Patient has had a hysterectomy. Contraception: status post hysterectomy Last Pap: 2015. H/o abn - 1995 h/o cryotherapy  Results were: normal Last mammogram: 01/2017 birad 1. Results were: normal  Obstetric History OB History  Gravida Para Term Preterm AB Living  3 2 2   1 2   SAB TAB Ectopic Multiple Live Births  1       2    # Outcome Date GA Lbr Len/2nd Weight Sex Delivery Anes PTL Lv  3 Term 1991   10 lb 1.6 oz (4.581 kg)  Vag-Spont   LIV  2 Term 1989   7 lb 2.4 oz (3.243 kg) F Vag-Spont   LIV  1 SAB 1984              Past Medical History:  Diagnosis Date  . Endometriosis   . Melanoma ([place])   . Migraines   . Sinusitis   . Vitamin D deficiency     Past Surgical History:  Procedure Laterality Date  . ABDOMINAL HYSTERECTOMY  2008  . endrometrial surgery    . FINGER SURGERY     11/2015 neg  . LAPAROSCOPY  2007  . TUBAL LIGATION  1991    Current Outpatient Prescriptions on File Prior to Visit  Medication Sig Dispense Refill  . zolpidem (AMBIEN CR) 6.25 MG CR tablet Take 1 tablet (6.25 mg total) by mouth at bedtime as needed for sleep. 30 tablet 0   No current facility-administered medications on file prior to visit.     Allergies  Allergen Reactions  . Imitrex [Sumatriptan] Shortness Of Breath    Social History   Social History  . Marital status: Married    Spouse name: N/A  . Number of children: N/A  . Years of education: N/A   Occupational History  . Not on file.   Social History Main Topics  . Smoking status: Never Smoker  . Smokeless tobacco: Never Used  . Alcohol use Yes  . Drug use: No  . Sexual activity: Yes    Birth control/ protection: None   Other Topics Concern  . Not on file   Social  History Narrative  . No narrative on file    Family History  Problem Relation Age of Onset  . Diabetes Father   . Heart disease Father   . Breast cancer Sister 7    The following portions of the patient's history were reviewed and updated as appropriate: allergies, current medications, past family history, past medical history, past social history, past surgical history and problem list.  Review of Systems ROS Review of Systems - General ROS: negative for - chills, fatigue, fever, hot flashes, night sweats, weight gain or weight loss Psychological ROS: negative for - anxiety, decreased libido, depression, mood swings, physical abuse or sexual abuse Ophthalmic ROS: negative for - blurry vision, eye pain or loss of vision ENT ROS: negative for - headaches, hearing change, visual changes or vocal changes Allergy and Immunology ROS: negative for - hives, itchy/watery eyes or seasonal allergies Hematological and Lymphatic ROS: negative for - bleeding problems, bruising, swollen lymph nodes or weight loss Endocrine ROS: negative for - galactorrhea, hair pattern changes, hot flashes, malaise/lethargy, mood swings,  palpitations, polydipsia/polyuria, skin changes, temperature intolerance or unexpected weight changes Breast ROS: negative for - new or changing breast lumps or nipple discharge Respiratory ROS: negative for - cough or shortness of breath Cardiovascular ROS: negative for - chest pain, irregular heartbeat, palpitations or shortness of breath Gastrointestinal ROS: no abdominal pain, change in bowel habits, or black or bloody stools Genito-Urinary ROS: no dysuria, trouble voiding, or hematuria Musculoskeletal ROS: negative for - joint pain or joint stiffness Neurological ROS: negative for - bowel and bladder control changes Dermatological ROS: negative for rash and skin lesion changes   Objective:   BP 101/68   Pulse 77   Ht 5\' 7"  (1.702 m)   Wt 163 lb 6.4 oz (74.1 kg)   BMI  25.59 kg/m  CONSTITUTIONAL: Well-developed, well-nourished female in no acute distress.  PSYCHIATRIC: Normal mood and affect. Normal behavior. Normal judgment and thought content. [place]: Alert and oriented to person, place, and time. Normal muscle tone coordination. No cranial nerve deficit noted. HENT:  Normocephalic, atraumatic, External right and left ear normal. Oropharynx is clear and moist EYES: Conjunctivae and EOM are normal. Pupils are equal, round, and reactive to light. No scleral icterus.  NECK: Normal range of motion, supple, no masses.  Normal thyroid.  SKIN: Skin is warm and dry. No rash noted. Not diaphoretic. No erythema. No pallor. CARDIOVASCULAR: Normal heart rate noted, regular rhythm, no murmur. RESPIRATORY: Clear to auscultation bilaterally. Effort and breath sounds normal, no problems with respiration noted. BREASTS: Symmetric in size. No masses, skin changes, nipple drainage, or lymphadenopathy. ABDOMEN: Soft, normal bowel sounds, no distention noted.  No tenderness, rebound or guarding.  BLADDER: Normal PELVIC:  External Genitalia: Normal  BUS: Normal  Vagina: Normal  Cervix: Normal  Uterus: absent   Adnexa:Overies and fallopian tubes absent   RV: External Exam NormaI  MUSCULOSKELETAL: Normal range of motion. No tenderness.  No cyanosis, clubbing, or edema.  2+ distal pulses. LYMPHATIC: No Axillary, Supraclavicular, or Inguinal Adenopathy.    Assessment:   Annual gynecologic examination 56 y.o. Contraception: status post hysterectomy Normal BMI Problem List Items Addressed This Visit    None      Plan:  Pap: Pap Co Test Mammogram: utd Stool Guaiac Testing:  colonoscopy at age 51- wnl Labs: thru employer Routine preventative health maintenance measures emphasized: Exercise/Diet/Weight control, Alcohol/Substance use risks and Stress Management   Samples of Interosa and uber lube given , she will call for prescription if she would like to continue  Interosa. Will follow up with pap results . Return to [place], [place]

## 2017-04-01 NOTE — Patient Instructions (Signed)
Preventive Care 40-64 Years, Female Preventive care refers to lifestyle choices and visits with your health care provider that can promote health and wellness. What does preventive care include?  A yearly physical exam. This is also called an annual well check.  Dental exams once or twice a year.  Routine eye exams. Ask your health care provider how often you should have your eyes checked.  Personal lifestyle choices, including: ? Daily care of your teeth and gums. ? Regular physical activity. ? Eating a healthy diet. ? Avoiding tobacco and drug use. ? Limiting alcohol use. ? Practicing safe sex. ? Taking low-dose aspirin daily starting at age 58. ? Taking vitamin and mineral supplements as recommended by your health care provider. What happens during an annual well check? The services and screenings done by your health care provider during your annual well check will depend on your age, overall health, lifestyle risk factors, and family history of disease. Counseling Your health care provider may ask you questions about your:  Alcohol use.  Tobacco use.  Drug use.  Emotional well-being.  Home and relationship well-being.  Sexual activity.  Eating habits.  Work and work Statistician.  Method of birth control.  Menstrual cycle.  Pregnancy history.  Screening You may have the following tests or measurements:  Height, weight, and BMI.  Blood pressure.  Lipid and cholesterol levels. These may be checked every 5 years, or more frequently if you are over 81 years old.  Skin check.  Lung cancer screening. You may have this screening every year starting at age 78 if you have a 30-pack-year history of smoking and currently smoke or have quit within the past 15 years.  Fecal occult blood test (FOBT) of the stool. You may have this test every year starting at age 65.  Flexible sigmoidoscopy or colonoscopy. You may have a sigmoidoscopy every 5 years or a colonoscopy  every 10 years starting at age 30.  Hepatitis C blood test.  Hepatitis B blood test.  Sexually transmitted disease (STD) testing.  Diabetes screening. This is done by checking your blood sugar (glucose) after you have not eaten for a while (fasting). You may have this done every 1-3 years.  Mammogram. This may be done every 1-2 years. Talk to your health care provider about when you should start having regular mammograms. This may depend on whether you have a family history of breast cancer.  BRCA-related cancer screening. This may be done if you have a family history of breast, ovarian, tubal, or peritoneal cancers.  Pelvic exam and Pap test. This may be done every 3 years starting at age 80. Starting at age 36, this may be done every 5 years if you have a Pap test in combination with an HPV test.  Bone density scan. This is done to screen for osteoporosis. You may have this scan if you are at high risk for osteoporosis.  Discuss your test results, treatment options, and if necessary, the need for more tests with your health care provider. Vaccines Your health care provider may recommend certain vaccines, such as:  Influenza vaccine. This is recommended every year.  Tetanus, diphtheria, and acellular pertussis (Tdap, Td) vaccine. You may need a Td booster every 10 years.  Varicella vaccine. You may need this if you have not been vaccinated.  Zoster vaccine. You may need this after age 5.  Measles, mumps, and rubella (MMR) vaccine. You may need at least one dose of MMR if you were born in  1957 or later. You may also need a second dose.  Pneumococcal 13-valent conjugate (PCV13) vaccine. You may need this if you have certain conditions and were not previously vaccinated.  Pneumococcal polysaccharide (PPSV23) vaccine. You may need one or two doses if you smoke cigarettes or if you have certain conditions.  Meningococcal vaccine. You may need this if you have certain  conditions.  Hepatitis A vaccine. You may need this if you have certain conditions or if you travel or work in places where you may be exposed to hepatitis A.  Hepatitis B vaccine. You may need this if you have certain conditions or if you travel or work in places where you may be exposed to hepatitis B.  Haemophilus influenzae type b (Hib) vaccine. You may need this if you have certain conditions.  Talk to your health care provider about which screenings and vaccines you need and how often you need them. This information is not intended to replace advice given to you by your health care provider. Make sure you discuss any questions you have with your health care provider. Document Released: 06/20/2015 Document Revised: 02/11/2016 Document Reviewed: 03/25/2015 Elsevier Interactive Patient Education  2017 [employer].

## 2017-04-01 NOTE — Progress Notes (Signed)
NNUAL PREVENTATIVE CARE GYN  ENCOUNTER NOTE  Subjective:       Kristie Hall is a 56 y.o. [phone] female here for a routine annual gynecologic exam.  Current complaints: 1.  Painful IC  2. Vaginal discharge      Gynecologic History No LMP recorded. Patient has had a hysterectomy. Contraception: status post hysterectomy Last Pap: 2015. H/o abn - 1995 h/o cryotherapy  Results were: normal Last mammogram: 01/2017 birad 1. Results were: normal  Obstetric History OB History  Gravida Para Term Preterm AB Living  3 2 2   1 2   SAB TAB Ectopic Multiple Live Births  1       2    # Outcome Date GA Lbr Len/2nd Weight Sex Delivery Anes PTL Lv  3 Term 1991   10 lb 1.6 oz (4.581 kg)  Vag-Spont   LIV  2 Term 1989   7 lb 2.4 oz (3.243 kg) F Vag-Spont   LIV  1 SAB 1984              Past Medical History:  Diagnosis Date  . Endometriosis   . Melanoma ([place])   . Migraines   . Sinusitis   . Vitamin D deficiency     Past Surgical History:  Procedure Laterality Date  . ABDOMINAL HYSTERECTOMY  2008  . endrometrial surgery    . FINGER SURGERY     11/2015 neg  . LAPAROSCOPY  2007  . TUBAL LIGATION  1991    Current Outpatient Prescriptions on File Prior to Visit  Medication Sig Dispense Refill  . zolpidem (AMBIEN CR) 6.25 MG CR tablet Take 1 tablet (6.25 mg total) by mouth at bedtime as needed for sleep. 30 tablet 0   No current facility-administered medications on file prior to visit.     Allergies  Allergen Reactions  . Imitrex [Sumatriptan] Shortness Of Breath    Social History   Social History  . Marital status: Married    Spouse name: N/A  . Number of children: N/A  . Years of education: N/A   Occupational History  . Not on file.   Social History Main Topics  . Smoking status: Never Smoker  . Smokeless tobacco: Never Used  . Alcohol use Yes  . Drug use: No  . Sexual activity: Yes    Birth control/ protection: None   Other Topics Concern  . Not on file    Social History Narrative  . No narrative on file    Family History  Problem Relation Age of Onset  . Diabetes Father   . Heart disease Father   . Breast cancer Sister 14    The following portions of the patient's history were reviewed and updated as appropriate: allergies, current medications, past family history, past medical history, past social history, past surgical history and problem list.  Review of Systems ROS Review of Systems - General ROS: negative for - chills, fatigue, fever, hot flashes, night sweats, weight gain or weight loss Psychological ROS: negative for - anxiety, decreased libido, depression, mood swings, physical abuse or sexual abuse Ophthalmic ROS: negative for - blurry vision, eye pain or loss of vision ENT ROS: negative for - headaches, hearing change, visual changes or vocal changes Allergy and Immunology ROS: negative for - hives, itchy/watery eyes or seasonal allergies Hematological and Lymphatic ROS: negative for - bleeding problems, bruising, swollen lymph nodes or weight loss Endocrine ROS: negative for - galactorrhea, hair pattern changes, hot flashes, malaise/lethargy, mood swings,  palpitations, polydipsia/polyuria, skin changes, temperature intolerance or unexpected weight changes Breast ROS: negative for - new or changing breast lumps or nipple discharge Respiratory ROS: negative for - cough or shortness of breath Cardiovascular ROS: negative for - chest pain, irregular heartbeat, palpitations or shortness of breath Gastrointestinal ROS: no abdominal pain, change in bowel habits, or black or bloody stools Genito-Urinary ROS: no dysuria, trouble voiding, or hematuria Musculoskeletal ROS: negative for - joint pain or joint stiffness Neurological ROS: negative for - bowel and bladder control changes Dermatological ROS: negative for rash and skin lesion changes   Objective:   BP 101/68   Pulse 77   Ht 5\' 7"  (1.702 m)   Wt 163 lb 6.4 oz (74.1 kg)    BMI 25.59 kg/m  CONSTITUTIONAL: Well-developed, well-nourished female in no acute distress.  PSYCHIATRIC: Normal mood and affect. Normal behavior. Normal judgment and thought content. [place]: Alert and oriented to person, place, and time. Normal muscle tone coordination. No cranial nerve deficit noted. HENT:  Normocephalic, atraumatic, External right and left ear normal. Oropharynx is clear and moist EYES: Conjunctivae and EOM are normal. Pupils are equal, round, and reactive to light. No scleral icterus.  NECK: Normal range of motion, supple, no masses.  Normal thyroid.  SKIN: Skin is warm and dry. No rash noted. Not diaphoretic. No erythema. No pallor. CARDIOVASCULAR: Normal heart rate noted, regular rhythm, no murmur. RESPIRATORY: Clear to auscultation bilaterally. Effort and breath sounds normal, no problems with respiration noted. BREASTS: Symmetric in size. No masses, skin changes, nipple drainage, or lymphadenopathy. ABDOMEN: Soft, normal bowel sounds, no distention noted.  No tenderness, rebound or guarding.  BLADDER: Normal PELVIC:  External Genitalia: Normal  BUS: Normal  Vagina: Normal  Cervix: Normal  Uterus: absent   Adnexa:Overies and fallopian tubes absent   RV: External Exam NormaI  MUSCULOSKELETAL: Normal range of motion. No tenderness.  No cyanosis, clubbing, or edema.  2+ distal pulses. LYMPHATIC: No Axillary, Supraclavicular, or Inguinal Adenopathy.    Assessment:   Annual gynecologic examination 56 y.o. Contraception: status post hysterectomy Normal BMI Problem List Items Addressed This Visit    None      Plan:  Pap: Pap Co Test Mammogram: utd Stool Guaiac Testing:  colonoscopy at age 82- wnl Labs: thru employer Routine preventative health maintenance measures emphasized: Exercise/Diet/Weight control, Alcohol/Substance use risks and Stress Management   Samples of Interosa and uber lube given , she will call for prescription if she would like to  continue Interosa. Will follow up with pap results . Return to [facility]

## 2017-04-06 LAB — IGP, COBASHPV16/18
HPV 16: NEGATIVE
HPV 18: NEGATIVE
HPV other hr types: NEGATIVE
PAP SMEAR COMMENT: 0

## 2017-04-08 ENCOUNTER — Telehealth: Payer: Self-pay | Admitting: Certified Nurse Midwife

## 2017-04-08 NOTE — Telephone Encounter (Signed)
Called to review pap results. Left message for her to call back.   Philip Aspen, CNM

## 2017-04-11 ENCOUNTER — Telehealth: Payer: Self-pay | Admitting: Certified Nurse Midwife

## 2017-04-11 NOTE — Telephone Encounter (Signed)
Call placed to [facility], reviewed. Pap smear results. Pt verbalizes understanding.   Philip Aspen, CNM

## 2017-09-20 ENCOUNTER — Other Ambulatory Visit: Payer: Self-pay | Admitting: *Deleted

## 2017-09-20 ENCOUNTER — Telehealth: Payer: Self-pay | Admitting: *Deleted

## 2017-09-20 DIAGNOSIS — Z1231 Encounter for screening mammogram for malignant neoplasm of breast: Secondary | ICD-10-CM

## 2017-09-20 NOTE — Telephone Encounter (Signed)
Order sent to [facility]

## 2017-09-20 NOTE — Telephone Encounter (Signed)
Patient states it is time for her Mammogram. Patient states she needs a order sent to [facility] . Please advise. Thank you

## 2018-04-04 ENCOUNTER — Other Ambulatory Visit: Payer: Self-pay | Admitting: Obstetrics and Gynecology

## 2018-04-04 ENCOUNTER — Ambulatory Visit
Admission: RE | Admit: 2018-04-04 | Discharge: 2018-04-04 | Disposition: A | Discharge status: Home / Self Care | Payer: 59 | Source: Ambulatory Visit | Attending: Obstetrics and Gynecology | Admitting: Obstetrics and Gynecology

## 2018-04-04 DIAGNOSIS — Z1231 Encounter for screening mammogram for malignant neoplasm of breast: Secondary | ICD-10-CM | POA: Diagnosis not present

## 2018-04-06 ENCOUNTER — Encounter: Payer: 59 | Admitting: Obstetrics and Gynecology

## 2018-04-21 ENCOUNTER — Encounter: Payer: Self-pay | Admitting: Obstetrics and Gynecology

## 2018-04-21 ENCOUNTER — Ambulatory Visit (INDEPENDENT_AMBULATORY_CARE_PROVIDER_SITE_OTHER): Payer: 59 | Admitting: Obstetrics and Gynecology

## 2018-04-21 VITALS — BP 107/78 | HR 82 | Ht 67.0 in | Wt 164.7 lb

## 2018-04-21 DIAGNOSIS — R5383 Other fatigue: Secondary | ICD-10-CM | POA: Diagnosis not present

## 2018-04-21 DIAGNOSIS — Z01419 Encounter for gynecological examination (general) (routine) without abnormal findings: Secondary | ICD-10-CM | POA: Diagnosis not present

## 2018-04-21 NOTE — Progress Notes (Signed)
Subjective:   Kristie Hall is a 57 y.o. [id number] Caucasian female here for a routine well-woman exam.  No LMP recorded. Patient has had a hysterectomy.    Current complaints: none PCP: me       work does labs, but thyroid off at last draw a few months ago.  Social History: Sexual: heterosexual Marital Status: married Living situation: with family Occupation: claims in insurance Tobacco/alcohol: no tobacco use Illicit drugs: no history of illicit drug use  The following portions of the patient's history were reviewed and updated as appropriate: allergies, current medications, past family history, past medical history, past social history, past surgical history and problem list.  Past Medical History Past Medical History:  Diagnosis Date  . Endometriosis   . Melanoma ([place])   . Migraines   . Sinusitis   . Vitamin D deficiency     Past Surgical History Past Surgical History:  Procedure Laterality Date  . ABDOMINAL HYSTERECTOMY  2008  . DILATION AND CURETTAGE OF UTERUS    . endrometrial surgery    . FINGER SURGERY     11/2015 neg  . LAPAROSCOPY  2007  . [place]    Gynecologic History [id number]  No LMP recorded. Patient has had a hysterectomy. Contraception: status post hysterectomy Last Pap: 03/2017. Results were: normal Last mammogram: 04/04/2018. Results were: normal   Obstetric History OB History  Gravida Para Term Preterm AB Living  3 2 2   1 2   SAB TAB Ectopic Multiple Live Births  1       2    # Outcome Date GA Lbr Len/2nd Weight Sex Delivery Anes PTL Lv  3 Term 1991   10 lb 1.6 oz (4.581 kg)  Vag-Spont   LIV  2 Term 1989   7 lb 2.4 oz (3.243 kg) F Vag-Spont   LIV  1 SAB 1984            Current Medications Current Outpatient Medications on File Prior to Visit  Medication Sig Dispense Refill  . zolpidem (AMBIEN CR) 6.25 MG CR tablet Take 1 tablet (6.25 mg total) by mouth at bedtime as needed for sleep. (Patient not taking: Reported on  04/21/2018) 30 tablet 0   No current facility-administered medications on file prior to visit.     Review of Systems Patient denies any headaches, blurred vision, shortness of breath, chest pain, abdominal pain, problems with bowel movements, urination, or intercourse.  Objective:  BP 107/78   Pulse 82   Ht 5\' 7"  (1.702 m)   Wt 164 lb 11.2 oz (74.7 kg)   BMI 25.80 kg/m  Physical Exam  General:  Well developed, well nourished, no acute distress. She is alert and oriented x3. Skin:  Warm and dry Neck:  Midline trachea, no thyromegaly or nodules Cardiovascular: Regular rate and rhythm, no murmur heard Lungs:  Effort normal, all lung fields clear to auscultation bilaterally Breasts:  No dominant palpable mass, retraction, or nipple discharge Abdomen:  Soft, non tender, no hepatosplenomegaly or masses Pelvic:  External genitalia is normal in appearance.  The vagina is normal in appearance. The cervix is bulbous, no CMT.  Thin prep pap is not done. Uterus is felt to be normal size, shape, and contour.  No adnexal masses or tenderness noted. Extremities:  No swelling or varicosities noted Psych:  She has a normal mood and affect  Assessment:   Healthy well-woman exam S/p hysterectomy Fatigue Sleep disturbance  Plan:  Labs obtained and  will follow up accordingly Declined flu vaccine  F/U 1 year for AE, or sooner if needed   Melody Rockney Ghee, CNM

## 2018-04-22 LAB — THYROID PANEL WITH TSH
Free Thyroxine Index: 2.1 (ref 1.2–4.9)
T3 UPTAKE RATIO: 27 % (ref 24–39)
T4 TOTAL: 7.9 ug/dL (ref 4.5–12.0)
TSH: 0.696 u[IU]/mL (ref 0.450–4.500)

## 2018-04-22 LAB — COMPREHENSIVE METABOLIC PANEL
ALBUMIN: 4.1 g/dL (ref 3.5–5.5)
ALT: 22 IU/L (ref 0–32)
AST: 15 IU/L (ref 0–40)
Albumin/Globulin Ratio: 1.6 (ref 1.2–2.2)
Alkaline Phosphatase: 99 IU/L (ref 39–117)
BILIRUBIN TOTAL: 1 mg/dL (ref 0.0–1.2)
BUN/Creatinine Ratio: 22 (ref 9–23)
BUN: 17 mg/dL (ref 6–24)
CO2: 25 mmol/L (ref 20–29)
Calcium: 9.4 mg/dL (ref 8.7–10.2)
Chloride: 100 mmol/L (ref 96–106)
Creatinine, Ser: 0.78 mg/dL (ref 0.57–1.00)
GFR calc Af Amer: 98 mL/min/{1.73_m2} (ref >59)
GFR, EST NON AFRICAN AMERICAN: 85 mL/min/{1.73_m2} (ref >59)
GLOBULIN, TOTAL: 2.6 g/dL (ref 1.5–4.5)
GLUCOSE: 75 mg/dL (ref 65–99)
POTASSIUM: 4.2 mmol/L (ref 3.5–5.2)
Sodium: 138 mmol/L (ref 134–144)
Total Protein: 6.7 g/dL (ref 6.0–8.5)

## 2018-04-22 LAB — B12 AND FOLATE PANEL
FOLATE: 10.7 ng/mL (ref >3.0)
Vitamin B-12: 978 pg/mL (ref 232–1245)

## 2018-04-22 LAB — VITAMIN D 25 HYDROXY (VIT D DEFICIENCY, FRACTURES): VIT D 25 HYDROXY: 23.5 ng/mL — AB (ref 30.0–100.0)

## 2018-04-26 ENCOUNTER — Telehealth: Payer: Self-pay | Admitting: *Deleted

## 2018-04-26 NOTE — Telephone Encounter (Signed)
-----   Message from Joylene Igo, [place] sent at 04/25/2018 11:32 AM EST ----- Please let her know labs are normal except vit D is low again, please have her restart suplement

## 2018-04-26 NOTE — Telephone Encounter (Signed)
Notified pt. 

## 2018-06-13 ENCOUNTER — Telehealth: Payer: Self-pay | Admitting: Obstetrics and Gynecology

## 2018-06-13 NOTE — Telephone Encounter (Signed)
The patient called and stated that she has a sinus infection and is needing a prescription sent in like she did the last time she had one. The patient also stated that [facility] [place]. Please advise.

## 2018-06-14 NOTE — Telephone Encounter (Signed)
Sent pt to urgent care

## 2018-10-20 DIAGNOSIS — S46019A Strain of muscle(s) and tendon(s) of the rotator cuff of unspecified shoulder, initial encounter: Secondary | ICD-10-CM | POA: Insufficient documentation

## 2019-02-19 ENCOUNTER — Other Ambulatory Visit: Payer: Self-pay | Admitting: Obstetrics and Gynecology

## 2019-02-19 ENCOUNTER — Other Ambulatory Visit: Payer: Self-pay | Admitting: *Deleted

## 2019-02-19 ENCOUNTER — Telehealth: Payer: Self-pay | Admitting: Obstetrics and Gynecology

## 2019-02-19 DIAGNOSIS — Z1231 Encounter for screening mammogram for malignant neoplasm of breast: Secondary | ICD-10-CM

## 2019-02-19 NOTE — Telephone Encounter (Signed)
Patient called and scheduled her annual exam, Pt is requesting have her mammogram orders placed so she is able to schedule her mammogram at [facility].Pt did not disclose any other information. Please advise.

## 2019-02-20 ENCOUNTER — Encounter: Payer: Self-pay | Admitting: *Deleted

## 2019-02-20 NOTE — Telephone Encounter (Signed)
Order put in.

## 2019-04-24 ENCOUNTER — Telehealth: Payer: Self-pay

## 2019-04-24 ENCOUNTER — Ambulatory Visit
Admission: RE | Admit: 2019-04-24 | Discharge: 2019-04-24 | Disposition: A | Discharge status: Home / Self Care | Payer: 59 | Source: Ambulatory Visit | Attending: Obstetrics and Gynecology | Admitting: Obstetrics and Gynecology

## 2019-04-24 ENCOUNTER — Encounter: Payer: 59 | Admitting: Obstetrics and Gynecology

## 2019-04-24 DIAGNOSIS — Z1231 Encounter for screening mammogram for malignant neoplasm of breast: Secondary | ICD-10-CM | POA: Insufficient documentation

## 2019-04-24 NOTE — Telephone Encounter (Signed)
error 

## 2019-05-21 ENCOUNTER — Encounter: Payer: 59 | Admitting: Certified Nurse Midwife

## 2019-07-16 ENCOUNTER — Other Ambulatory Visit: Payer: Self-pay

## 2019-07-16 ENCOUNTER — Ambulatory Visit: Payer: 59 | Admitting: Certified Nurse Midwife

## 2019-07-16 ENCOUNTER — Encounter: Payer: Self-pay | Admitting: Certified Nurse Midwife

## 2019-07-16 VITALS — BP 122/68 | HR 73 | Ht 67.0 in | Wt 171.1 lb

## 2019-07-16 DIAGNOSIS — Z01419 Encounter for gynecological examination (general) (routine) without abnormal findings: Secondary | ICD-10-CM

## 2019-07-16 DIAGNOSIS — Z1231 Encounter for screening mammogram for malignant neoplasm of breast: Secondary | ICD-10-CM

## 2019-07-16 MED ORDER — KETOPROFEN 50 MG PO CAPS: 50.0000 mg | ORAL_CAPSULE | Freq: Four times a day (QID) | ORAL | 0 refills | Status: DC | PRN

## 2019-07-16 MED ORDER — RIZATRIPTAN BENZOATE 5 MG PO TABS: 5.0000 mg | ORAL_TABLET | ORAL | 0 refills | Status: DC | PRN

## 2019-07-16 NOTE — Addendum Note (Signed)
Addended by: Elouise Munroe on: 07/16/2019 10:06 AM   Modules accepted: Level of Service

## 2019-07-16 NOTE — Patient Instructions (Signed)

## 2019-07-16 NOTE — Progress Notes (Signed)
GYNECOLOGY ANNUAL PREVENTATIVE CARE ENCOUNTER NOTE  History:     Kristie Hall is a 59 y.o. [phone] female here for a routine annual gynecologic exam.  Current complaints: migraines , she has been [facility] in the past [facility] and was placed on Maxal and ketoprofen. She is requesting refills on these medications.  She states they have worked well for her in the past. Vaginal dryness.  Denies abnormal vaginal bleeding, discharge, pelvic pain, problems with intercourse or other gynecologic concerns.     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 Patient has had a hysterectomy. Contraception: status post hysterectomy Last Pap: 2018. Results were: normal with negative HPV Last mammogram: 04/24/19. Results were: normal  Obstetric History OB History  Gravida Para Term Preterm AB Living  3 2 2   1 2   SAB TAB Ectopic Multiple Live Births  1       2    # Outcome Date GA Lbr Len/2nd Weight Sex Delivery Anes PTL Lv  3 Term 1991   10 lb 1.6 oz (4.581 kg)  Vag-Spont   LIV  2 Term 1989   7 lb 2.4 oz (3.243 kg) F Vag-Spont   LIV  1 SAB 1984            Past Medical History:  Diagnosis Date  . Endometriosis   . Melanoma ([place])    melanoma leg  . Migraines   . Sinusitis   . Vitamin D deficiency     Past Surgical History:  Procedure Laterality Date  . ABDOMINAL HYSTERECTOMY  2008  . DILATION AND CURETTAGE OF UTERUS    . endrometrial surgery    . FINGER SURGERY     11/2015 neg  . LAPAROSCOPY  2007  . TUBAL LIGATION  1991    Current Outpatient Medications on File Prior to Visit  Medication Sig Dispense Refill  . zolpidem (AMBIEN CR) 6.25 MG CR tablet Take 1 tablet  (6.25 mg total) by mouth at bedtime as needed for sleep. (Patient not taking: Reported on 04/21/2018) 30 tablet 0   No current facility-administered medications on file prior to visit.    Allergies  Allergen Reactions  . Imitrex [Sumatriptan] Shortness Of Breath    Social History:  reports that she has never smoked. She has never used smokeless tobacco. She reports current alcohol use. She reports that she does not use drugs.  Family History  Problem Relation Age of Onset  . Diabetes Father   . Heart disease Father   . Breast cancer Sister 84  . Ovarian cancer Neg Hx   . Colon cancer Neg Hx     The following portions of the patient's history were reviewed and updated as appropriate: allergies, current medications, past family history, past medical history, past social history, past surgical history and problem list.  Review of Systems Pertinent items noted in HPI and remainder of comprehensive ROS otherwise negative.  Physical Exam:  There were no vitals taken for this visit. CONSTITUTIONAL: Well-developed, well-nourished female in no acute distress.  HENT:  Normocephalic, atraumatic, External right and left ear normal. Oropharynx is clear and moist EYES: Conjunctivae and EOM are normal. Pupils are equal, round, and reactive to light. No scleral icterus.  NECK: Normal range of motion, supple, no masses.  Normal thyroid.  SKIN: Skin is warm and dry. No rash noted. Not diaphoretic. No erythema. No pallor. MUSCULOSKELETAL: Normal range of motion. No tenderness.  No cyanosis, clubbing, or edema.  2+ distal pulses. NEUROLOGIC: Alert and oriented to person, place, and time. Normal reflexes, muscle tone coordination.  PSYCHIATRIC: Normal mood and affect. Normal behavior. Normal judgment and thought content. CARDIOVASCULAR: Normal heart rate noted, regular rhythm RESPIRATORY: Clear to auscultation bilaterally. Effort and breath sounds normal, no problems with respiration noted. BREASTS:  Symmetric in size. No masses, tenderness, skin changes, nipple drainage, or lymphadenopathy bilaterally. ABDOMEN: Soft, no distention noted.  No tenderness, rebound or guarding.  PELVIC: Normal appearing external genitalia and urethral meatus; normal appearing vaginal mucosa and cervix.  No abnormal discharge noted.  Pap smear not indicated.  Normal uterine size, no other palpable masses, no uterine or adnexal tenderness.   Assessment and Plan:    1. Women's annual routine gynecological examination  Pap smear not indicated Mammogram ordered Labs: none due Refills: Maxalt, keoprofen Recommend use of vaginal moisturizer.   Routine preventative health maintenance measures emphasized. Please refer to After Visit Summary for other counseling recommendations.      Philip Aspen, CNM

## 2019-07-17 ENCOUNTER — Other Ambulatory Visit: Payer: Self-pay | Admitting: Certified Nurse Midwife

## 2019-07-18 ENCOUNTER — Telehealth: Payer: Self-pay

## 2019-07-18 NOTE — Telephone Encounter (Signed)
mychart message sent to patient- Orudis is on back order per pharmacy.

## 2019-07-24 ENCOUNTER — Telehealth: Payer: Self-pay

## 2019-07-24 NOTE — Telephone Encounter (Signed)
mychart message sent to patient- did she get her medicine ok?

## 2019-08-01 ENCOUNTER — Other Ambulatory Visit: Payer: Self-pay

## 2019-08-01 ENCOUNTER — Telehealth: Payer: Self-pay

## 2019-08-01 MED ORDER — IBUPROFEN 800 MG PO TABS: 800.0000 mg | ORAL_TABLET | Freq: Three times a day (TID) | ORAL | 0 refills | Status: DC | PRN

## 2019-08-01 NOTE — Telephone Encounter (Signed)
Pt aware [facility]   Pt aware ketoprofen on back order per pharmacy ibuprofen is the substitution.

## 2019-08-01 NOTE — Telephone Encounter (Signed)
Message sent to [facility]

## 2020-07-02 ENCOUNTER — Ambulatory Visit
Admission: RE | Admit: 2020-07-02 | Discharge: 2020-07-02 | Disposition: A | Discharge status: Home / Self Care | Payer: BC Managed Care – PPO | Source: Ambulatory Visit | Attending: Certified Nurse Midwife | Admitting: Certified Nurse Midwife

## 2020-07-02 ENCOUNTER — Other Ambulatory Visit: Payer: Self-pay

## 2020-07-02 DIAGNOSIS — Z1231 Encounter for screening mammogram for malignant neoplasm of breast: Secondary | ICD-10-CM | POA: Diagnosis present

## 2020-07-02 DIAGNOSIS — Z01419 Encounter for gynecological examination (general) (routine) without abnormal findings: Secondary | ICD-10-CM | POA: Diagnosis not present

## 2020-07-28 ENCOUNTER — Other Ambulatory Visit: Payer: Self-pay

## 2020-07-28 ENCOUNTER — Telehealth: Payer: Self-pay | Admitting: Certified Nurse Midwife

## 2020-07-28 ENCOUNTER — Encounter: Payer: Self-pay | Admitting: Certified Nurse Midwife

## 2020-07-28 ENCOUNTER — Ambulatory Visit (INDEPENDENT_AMBULATORY_CARE_PROVIDER_SITE_OTHER): Payer: BC Managed Care – PPO | Admitting: Certified Nurse Midwife

## 2020-07-28 VITALS — BP 127/74 | HR 77 | Ht 67.0 in | Wt 176.2 lb

## 2020-07-28 DIAGNOSIS — Z1231 Encounter for screening mammogram for malignant neoplasm of breast: Secondary | ICD-10-CM

## 2020-07-28 DIAGNOSIS — Z01419 Encounter for gynecological examination (general) (routine) without abnormal findings: Secondary | ICD-10-CM

## 2020-07-28 MED ORDER — RIZATRIPTAN BENZOATE 5 MG PO TABS: 5.0000 mg | ORAL_TABLET | ORAL | 1 refills | Status: DC | PRN

## 2020-07-28 NOTE — Progress Notes (Addendum)
GYNECOLOGY ANNUAL PREVENTATIVE CARE ENCOUNTER NOTE  History:     Kristie Hall is a 60 y.o. [phone] female here for a routine annual gynecologic exam.  Current complaints: no change in the vaginal dryness.   Denies abnormal vaginal bleeding, discharge, pelvic pain, problems with intercourse or other gynecologic concerns.     Social Relationship:married  Living:with her family Work: Mining engineer Exercise: none Smoke/Alcohol/drug use: denies smoking and drug use, occ alcohol use.  Gynecologic History No LMP recorded. Patient has had a hysterectomy. Contraception: status post hysterectomy Last Pap: 2018 Results were: normal with negative HPV Last mammogram: 07/02/20. Results were: normal  Obstetric History OB History  Gravida Para Term Preterm AB Living  3 2 2   1 2   SAB IAB Ectopic Multiple Live Births  1       2    # Outcome Date GA Lbr Len/2nd Weight Sex Delivery Anes PTL Lv  3 Term 1991   10 lb 1.6 oz (4.581 kg)  Vag-Spont   LIV  2 Term 1989   7 lb 2.4 oz (3.243 kg) F Vag-Spont   LIV  1 SAB 1984            Past Medical History:  Diagnosis Date  . Endometriosis   . Melanoma ([place])    melanoma leg  . Migraines   . Sinusitis   . Vitamin D deficiency     Past Surgical History:  Procedure Laterality Date  . ABDOMINAL HYSTERECTOMY  2008  . DILATION AND CURETTAGE OF UTERUS    . endrometrial surgery    . FINGER SURGERY     11/2015 neg  . LAPAROSCOPY  2007  . SHOULDER ARTHROSCOPY W/ ROTATOR CUFF REPAIR Right   . TUBAL LIGATION  1991    Current Outpatient Medications on File Prior to Visit  Medication Sig Dispense Refill  . ibuprofen (ADVIL) 800 MG tablet Take 1 tablet (800 mg total) by mouth every 8 (eight) hours as needed. 30 tablet 0  . rizatriptan (MAXALT) 5 MG tablet Take 1 tablet (5 mg total) by mouth as needed for migraine. May repeat in 2 hours if needed 10 tablet 0  . zolpidem (AMBIEN CR) 6.25 MG CR tablet Take 1 tablet (6.25 mg total) by  mouth at bedtime as needed for sleep. (Patient not taking: Reported on 04/21/2018) 30 tablet 0   No current facility-administered medications on file prior to visit.    Allergies  Allergen Reactions  . Imitrex [Sumatriptan] Shortness Of Breath    Social History:  reports that she has never smoked. She has never used smokeless tobacco. She reports current alcohol use. She reports that she does not use drugs.  Family History  Problem Relation Age of Onset  . Diabetes Father   . Heart disease Father   . Breast cancer Sister 33  . Ovarian cancer Neg Hx   . Colon cancer Neg Hx     The following portions of the patient's history were reviewed and updated as appropriate: allergies, current medications, past family history, past medical history, past social history, past surgical history and problem list.  Review of Systems Pertinent items noted in HPI and remainder of comprehensive ROS otherwise negative.  Physical Exam:  There were no vitals taken for this visit. CONSTITUTIONAL: Well-developed, well-nourished female in no acute distress.  HENT:  Normocephalic, atraumatic, External right and left ear normal. Oropharynx is clear and moist EYES: Conjunctivae and EOM are normal. Pupils are equal, round,  and reactive to light. No scleral icterus.  NECK: Normal range of motion, supple, no masses.  Normal thyroid.  SKIN: Skin is warm and dry. No rash noted. Not diaphoretic. No erythema. No pallor. MUSCULOSKELETAL: Normal range of motion. No tenderness.  No cyanosis, clubbing, or edema.  2+ distal pulses. NEUROLOGIC: Alert and oriented to person, place, and time. Normal reflexes, muscle tone coordination.  PSYCHIATRIC: Normal mood and affect. Normal behavior. Normal judgment and thought content. CARDIOVASCULAR: Normal heart rate noted, regular rhythm RESPIRATORY: Clear to auscultation bilaterally. Effort and breath sounds normal, no problems with respiration noted. BREASTS: Symmetric in size.  No masses, tenderness, skin changes, nipple drainage, or lymphadenopathy bilaterally.  ABDOMEN: Soft, no distention noted.  No tenderness, rebound or guarding.  PELVIC: Normal appearing external genitalia and urethral meatus; normal appearing vaginal mucosa and cervix.  No abnormal discharge noted.  Pap smear obtained.  Normal uterine size, no other palpable masses, no uterine or adnexal tenderness.  .   Assessment and Plan:    1. Women's annual routine gynecological examination  Pap: not due Mammogram : completed 07/02/20 Labs:  Declines  Refills: Maxalt , Ketoprofen  orders:declines flu and tetanus  Referral: none Discussed vaginal dryness, she has used vaginal moisturizers. Declines use of hormone  replacement  Routine preventative health maintenance measures emphasized. Please refer to After Visit Summary for other counseling recommendations.      Philip Aspen, CNM [facility]

## 2020-07-28 NOTE — Telephone Encounter (Signed)
Pt called stating that one of her RX were not sent to her pharmacy from this morning visit - [facility] [place]

## 2020-07-28 NOTE — Patient Instructions (Signed)
Preventive Care 60-60 Years Old, Female Preventive care refers to lifestyle choices and visits with your health care provider that can promote health and wellness. This includes:  A yearly physical exam. This is also called an annual wellness visit.  Regular dental and eye exams.  Immunizations.  Screening for certain conditions.  Healthy lifestyle choices, such as: ? Eating a healthy diet. ? Getting regular exercise. ? Not using drugs or products that contain nicotine and tobacco. ? Limiting alcohol use. What can I expect for my preventive care visit? Physical exam Your health care provider will check your:  Height and weight. These may be used to calculate your BMI (body mass index). BMI is a measurement that tells if you are at a healthy weight.  Heart rate and blood pressure.  Body temperature.  Skin for abnormal spots. Counseling Your health care provider may ask you questions about your:  Past medical problems.  Family's medical history.  Alcohol, tobacco, and drug use.  Emotional well-being.  Home life and relationship well-being.  Sexual activity.  Diet, exercise, and sleep habits.  Work and work Statistician.  Access to firearms.  Method of birth control.  Menstrual cycle.  Pregnancy history. What immunizations do I need? Vaccines are usually given at various ages, according to a schedule. Your health care provider will recommend vaccines for you based on your age, medical history, and lifestyle or other factors, such as travel or where you work.   What tests do I need? Blood tests  Lipid and cholesterol levels. These may be checked every 5 years, or more often if you are over 60 years old.  Hepatitis C test.  Hepatitis B test. Screening  Lung cancer screening. You may have this screening every year starting at age 60 if you have a 30-pack-year history of smoking and currently smoke or have quit within the past 15 years.  Colorectal cancer  screening. ? All adults should have this screening starting at age 60 and continuing until age 17. ? Your health care provider may recommend screening at age 60 if you are at increased risk. ? You will have tests every 1-10 years, depending on your results and the type of screening test.  Diabetes screening. ? This is done by checking your blood sugar (glucose) after you have not eaten for a while (fasting). ? You may have this done every 1-3 years.  Mammogram. ? This may be done every 1-2 years. ? Talk with your health care provider about when you should start having regular mammograms. This may depend on whether you have a family history of breast cancer.  BRCA-related cancer screening. This may be done if you have a family history of breast, ovarian, tubal, or peritoneal cancers.  Pelvic exam and Pap test. ? This may be done every 3 years starting at age 10. ? Starting at age 11, this may be done every 5 years if you have a Pap test in combination with an HPV test. Other tests  STD (sexually transmitted disease) testing, if you are at risk.  Bone density scan. This is done to screen for osteoporosis. You may have this scan if you are at high risk for osteoporosis. Talk with your health care provider about your test results, treatment options, and if necessary, the need for more tests. Follow these instructions at home: Eating and drinking  Eat a diet that includes fresh fruits and vegetables, whole grains, lean protein, and low-fat dairy products.  Take vitamin and mineral supplements  as recommended by your health care provider.  Do not drink alcohol if: ? Your health care provider tells you not to drink. ? You are pregnant, may be pregnant, or are planning to become pregnant.  If you drink alcohol: ? Limit how much you have to 0-1 drink a day. ? Be aware of how much alcohol is in your drink. In the [place] one drink equals one 12 oz bottle of beer (355 mL), one 5 oz glass of  wine (148 mL), or one 1 oz glass of hard liquor (44 mL).   Lifestyle  Take daily care of your teeth and gums. Brush your teeth every morning and night with fluoride toothpaste. Floss one time each day.  Stay active. Exercise for at least 30 minutes 5 or more days each week.  Do not use any products that contain nicotine or tobacco, such as cigarettes, e-cigarettes, and chewing tobacco. If you need help quitting, ask your health care provider.  Do not use drugs.  If you are sexually active, practice safe sex. Use a condom or other form of protection to prevent STIs (sexually transmitted infections).  If you do not wish to become pregnant, use a form of birth control. If you plan to become pregnant, see your health care provider for a prepregnancy visit.  If told by your health care provider, take low-dose aspirin daily starting at age 60.  Find healthy ways to cope with stress, such as: ? Meditation, yoga, or listening to music. ? Journaling. ? Talking to a trusted person. ? Spending time with friends and family. Safety  Always wear your seat belt while driving or riding in a vehicle.  Do not drive: ? If you have been drinking alcohol. Do not ride with someone who has been drinking. ? When you are tired or distracted. ? While texting.  Wear a helmet and other protective equipment during sports activities.  If you have firearms in your house, make sure you follow all gun safety procedures. What's next?  Visit your health care provider once a year for an annual wellness visit.  Ask your health care provider how often you should have your eyes and teeth checked.  Stay up to date on all vaccines. This information is not intended to replace advice given to you by your health care provider. Make sure you discuss any questions you have with your health care provider. Document Revised: 02/26/2020 Document Reviewed: 02/02/2018 Elsevier Patient Education  2021 Elsevier Inc.  

## 2020-07-28 NOTE — Telephone Encounter (Signed)
LM   Need to know the name of the medication.

## 2020-07-28 NOTE — Telephone Encounter (Signed)
Pt is requesting a rx for Ketoprofen 50 mg direction- as needed.   Pls advise.

## 2020-07-29 MED ORDER — KETOPROFEN 50 MG PO CAPS: 50.0000 mg | ORAL_CAPSULE | Freq: Four times a day (QID) | ORAL | 0 refills | Status: AC | PRN

## 2020-07-29 NOTE — Telephone Encounter (Signed)
I put it in , please let her know.   Thanks  Deneise Lever

## 2020-07-29 NOTE — Addendum Note (Signed)
Addended by: Hildred Priest on: 07/29/2020 11:16 AM   Modules accepted: Orders

## 2020-07-29 NOTE — Telephone Encounter (Signed)
Pt aware per vm.  

## 2020-09-22 ENCOUNTER — Other Ambulatory Visit: Payer: Self-pay | Admitting: Certified Nurse Midwife

## 2020-09-22 NOTE — Telephone Encounter (Signed)
Please advise on refill.

## 2020-11-19 ENCOUNTER — Telehealth: Payer: Self-pay | Admitting: Certified Nurse Midwife

## 2020-11-19 NOTE — Telephone Encounter (Signed)
Patient called asking for the dates of her last biometric screening. I did provide the dates of her last physical and mammogram. Pt states that she had labs done prior to physical and is requesting clinical information regarding that . Please Advise.

## 2020-11-24 NOTE — Telephone Encounter (Signed)
Called patient to inform her that no Biometric Labs were in the system. She verbalized understanding and will call [facility] for those results.

## 2021-05-08 ENCOUNTER — Telehealth: Payer: Self-pay | Admitting: Certified Nurse Midwife

## 2021-05-08 NOTE — Telephone Encounter (Signed)
Pt called states that she has been having rectal bleeding that started on Wed. States this is the 2-3rd occurences. Pt denies any strenuous bowel movements. Asked pt if she had a PCP or GI doctor- she states she does not. I provided the next opening for Kristie Hall as that is who she has seen in the past- end of December, Pt asked to see a doctor (Dr.Cherry), I scheduled next opening.

## 2021-05-08 NOTE — Telephone Encounter (Signed)
We can call GI and get a same day or next day appointment as well. Let's check with them on Monday

## 2021-05-14 ENCOUNTER — Encounter: Payer: Self-pay | Admitting: Obstetrics and Gynecology

## 2021-05-14 ENCOUNTER — Ambulatory Visit (INDEPENDENT_AMBULATORY_CARE_PROVIDER_SITE_OTHER): Payer: BC Managed Care – PPO | Admitting: Obstetrics and Gynecology

## 2021-05-14 ENCOUNTER — Other Ambulatory Visit: Payer: Self-pay

## 2021-05-14 VITALS — BP 118/82 | HR 104 | Ht 67.0 in | Wt 168.8 lb

## 2021-05-14 DIAGNOSIS — Z1211 Encounter for screening for malignant neoplasm of colon: Secondary | ICD-10-CM | POA: Diagnosis not present

## 2021-05-14 DIAGNOSIS — J0100 Acute maxillary sinusitis, unspecified: Secondary | ICD-10-CM

## 2021-05-14 DIAGNOSIS — K625 Hemorrhage of anus and rectum: Secondary | ICD-10-CM | POA: Diagnosis not present

## 2021-05-14 DIAGNOSIS — K649 Unspecified hemorrhoids: Secondary | ICD-10-CM

## 2021-05-14 DIAGNOSIS — R1011 Right upper quadrant pain: Secondary | ICD-10-CM

## 2021-05-14 MED ORDER — AZITHROMYCIN 250 MG PO TABS: ORAL_TABLET | ORAL | 1 refills | Status: DC

## 2021-05-14 NOTE — Progress Notes (Signed)
    GYNECOLOGY PROGRESS NOTE  Subjective:    Patient ID: Kristie Hall, female    DOB: [birth year], 60 y.o.   MRN: [id number]  HPI  Patient is a 60 y.o. G31P2012 female who presents for multiple complaints:   Reports rectal bleeding that began yesterday.  Denies rectal pain.  She does have a history of hemorrhoids, for which she has a cream at home that she uses.  Of note, she has had intermittent bleeding for the past month.  States that she felt some "tissue" come out yesterday, so felt concerned. Has a sinus infection.  Notes that she gets a head cold/sinus infection once a year. Requests Z-pack. Has had symptoms for ~ 2 weeks (sinus drainage and tenderness under the eyes). Does not have a PCP.  Reports complaints of right-sided upper abdominal pain beneath her ribs. Onxet 2 weeks ago, is intermittent. Sometimes she has to hold her stomach to make the pain go away. Has been worked up in the past for gallbladder issues but noted negative findings.    The following portions of the patient's history were reviewed and updated as appropriate: allergies, current medications, past family history, past medical history, past social history, past surgical history, and problem list.  Review of Systems Pertinent items noted in HPI and remainder of comprehensive ROS otherwise negative.   Objective:   Blood pressure 118/82, pulse (!) 104, height 5\' 7"  (1.702 m), weight 168 lb 12.8 oz (76.6 kg). Body mass index is 26.44 kg/m. General appearance: alert and no distress Abdomen: normal findings: bowel sounds normal, no masses palpable, and soft and abnormal findings:  mild tenderness in the RUQ to deep palpation Pelvic: external genitalia normal.  Rectal: external genitalia with skin tags, small hemorrhoid. Internal exam also with possible hemorrhoid  Extremities: Non-tender, no edema   Assessment:   1. Rectal bleed   2. Hemorrhoids, unspecified hemorrhoid type   3. Colon cancer screening   4. Acute  maxillary sinusitis, recurrence not specified   5. RUQ pain      Plan:   Discussed treatment measures for hemorrhoids.  Patient is also due for  colonoscopy, will place referral.  Acute sinusitis - patient reports having one once a year, usually responds to Z-pack. Requests prescription.   RUQ pain, intermittent. Not related to meal consumption.  Unsure if it is GI or musculoskeletal in nature. Advised that if it does not resolve over the next few weeks, should get referral to a PCP to further investigate.     Rubie Maid, MD [facility]

## 2021-05-16 ENCOUNTER — Encounter: Payer: Self-pay | Admitting: Obstetrics and Gynecology

## 2021-05-22 ENCOUNTER — Encounter: Payer: Self-pay | Admitting: Obstetrics and Gynecology

## 2021-05-22 DIAGNOSIS — R1011 Right upper quadrant pain: Secondary | ICD-10-CM

## 2021-05-31 ENCOUNTER — Other Ambulatory Visit: Payer: Self-pay | Admitting: Obstetrics and Gynecology

## 2021-05-31 DIAGNOSIS — Z1211 Encounter for screening for malignant neoplasm of colon: Secondary | ICD-10-CM

## 2021-06-03 ENCOUNTER — Telehealth: Payer: Self-pay

## 2021-06-03 NOTE — Telephone Encounter (Signed)
Called patient to schedule colonoscopy patient does not want have a colonoscopy  so we can delete that referral she is scheduled for a appointment to be seen in office

## 2021-06-29 ENCOUNTER — Other Ambulatory Visit: Payer: Self-pay

## 2021-06-29 ENCOUNTER — Encounter: Payer: Self-pay | Admitting: Gastroenterology

## 2021-06-29 ENCOUNTER — Ambulatory Visit: Payer: BC Managed Care – PPO | Admitting: Gastroenterology

## 2021-06-29 ENCOUNTER — Ambulatory Visit (INDEPENDENT_AMBULATORY_CARE_PROVIDER_SITE_OTHER): Payer: BC Managed Care – PPO | Admitting: Gastroenterology

## 2021-06-29 VITALS — BP 143/84 | HR 75 | Temp 97.1°F | Ht 67.0 in | Wt 163.0 lb

## 2021-06-29 DIAGNOSIS — Z1211 Encounter for screening for malignant neoplasm of colon: Secondary | ICD-10-CM

## 2021-06-29 DIAGNOSIS — R1011 Right upper quadrant pain: Secondary | ICD-10-CM

## 2021-06-29 MED ORDER — DICYCLOMINE HCL 10 MG PO CAPS: 10.0000 mg | ORAL_CAPSULE | Freq: Three times a day (TID) | ORAL | 0 refills | Status: DC | PRN

## 2021-06-29 NOTE — Progress Notes (Signed)
Jonathon Bellows MD, MRCP(U.K) [address]  [place]  [place], [place] [address]  Main: [phone]  Fax: [phone]   Gastroenterology Consultation  Referring Provider:     Philip Aspen, CNM Primary Care Physician:  Philip Aspen, CNM Primary Gastroenterologist:  Dr. Jonathon Bellows  Reason for Consultation:     Rectal bleeding         HPI:   Kristie Hall is a 61 y.o. y/o female referred for rectal bleeding but the patient states that she is here for right upper quadrant pain.  She states that she has had it for many years.  Last for many hours.  Usually does not have it when she wakes up.  Subsequently develops it worse after she eats foods particularly fatty in nature.  Localized cramping in nature nonradiating.  Aggravating factors is eating.  Relieving factors is slight relieved after bowel movement.  No NSAID use.  Mother and daughter had issues with the gallbladder and had to have it taken out.  Last colonoscopy was over 10 years back.  The culprit was normal.  Denies any constipation.  Has a bowel movement daily.  Soft in nature.     Past Medical History:  Diagnosis Date   Endometriosis    Melanoma ([place])    melanoma leg   Migraines    Sinusitis    Vitamin D deficiency     Past Surgical History:  Procedure Laterality Date   ABDOMINAL HYSTERECTOMY  2008   DILATION AND CURETTAGE OF UTERUS     endrometrial surgery     FINGER SURGERY     11/2015 neg   LAPAROSCOPY  2007   SHOULDER ARTHROSCOPY W/ ROTATOR CUFF REPAIR Right    TUBAL LIGATION  1991    Prior to Admission medications   Medication Sig Start Date End Date Taking? Authorizing Provider  azithromycin (ZITHROMAX) 250 MG tablet Take as directed: Two pills by mouth the first day, then one pill every day until completed 05/14/21   Rubie Maid, MD  ketoprofen (ORUDIS) 50 MG capsule Take 1 capsule (50 mg total) by mouth 4 (four) times daily as needed. 07/29/20   Philip Aspen, CNM  rizatriptan (MAXALT) 5 MG  tablet TAKE 1 TABLET BY MOUTH AS NEEDED FOR MIGRAINE. MAY REPEAT IN 2 HOURS IF NEEDED 09/22/20   Philip Aspen, CNM    Family History  Problem Relation Age of Onset   Diabetes Father    Heart disease Father    Breast cancer Sister 58   Ovarian cancer Neg Hx    Colon cancer Neg Hx      Social History   Tobacco Use   Smoking status: Never   Smokeless tobacco: Never  Vaping Use   Vaping Use: Never used  Substance Use Topics   Alcohol use: Yes    Comment: occas   Drug use: No    Allergies as of 06/29/2021 - Review Complete 06/29/2021  Allergen Reaction Noted   Imitrex [sumatriptan] Shortness Of Breath 05/23/2015    Review of Systems:    All systems reviewed and negative except where noted in HPI.   Physical Exam:  BP (!) 143/84    Pulse 75    Temp (!) 97.1 F (36.2 C) (Oral)    Ht 5\' 7"  (1.702 m)    Wt 163 lb (73.9 kg)    BMI 25.53 kg/m  No LMP recorded. Patient has had a hysterectomy. Psych:  Alert and cooperative. Normal mood and affect. General:  Alert,  Well-developed, well-nourished, pleasant and cooperative in NAD Head:  Normocephalic and atraumatic. Eyes:  Sclera clear, no icterus.   Conjunctiva pink. Ears:  Normal auditory acuity.  Abdomen:  Normal bowel sounds.  No bruits.  Soft, non-tender and non-distended without masses, hepatosplenomegaly or hernias noted.  No guarding or rebound tenderness.    Neurologic:  Alert and oriented x3;  grossly normal neurologically. Psych:  Alert and cooperative. Normal mood and affect.  Imaging Studies: No results found.  Assessment and Plan:   Kristie Hall is a 61 y.o. y/o female  is here today to see me for right upper quadrant pain. The description of the pain has some features which are biliary in nature.  But not completely treating the picture.  Plan  Colonoscopy for colon cancer screening 2.  Right upper quadrant ultrasound and HIDA scan 3.  Bentyl 10 mg 4 times daily as needed for pain 4.  If no better at  next visit we will consider CT scan of the abdomen  I have discussed alternative options, risks & benefits,  which include, but are not limited to, bleeding, infection, perforation,respiratory complication & drug reaction.  The patient agrees with this plan & written consent will be obtained.     Follow up in 6 weeks  Dr Jonathon Bellows MD,MRCP(U.K)

## 2021-06-29 NOTE — Addendum Note (Signed)
Addended by: Wayna Chalet on: 06/29/2021 03:47 PM   Modules accepted: Orders

## 2021-06-30 LAB — COMPREHENSIVE METABOLIC PANEL
ALT: 53 IU/L — ABNORMAL HIGH (ref 0–32)
AST: 36 IU/L (ref 0–40)
Albumin/Globulin Ratio: 1.6 (ref 1.2–2.2)
Albumin: 4.4 g/dL (ref 3.8–4.9)
Alkaline Phosphatase: 223 IU/L — ABNORMAL HIGH (ref 44–121)
BUN/Creatinine Ratio: 40 — ABNORMAL HIGH (ref 12–28)
BUN: 24 mg/dL (ref 8–27)
Bilirubin Total: 0.5 mg/dL (ref 0.0–1.2)
CO2: 22 mmol/L (ref 20–29)
Calcium: 9.5 mg/dL (ref 8.7–10.3)
Chloride: 102 mmol/L (ref 96–106)
Creatinine, Ser: 0.6 mg/dL (ref 0.57–1.00)
Globulin, Total: 2.8 g/dL (ref 1.5–4.5)
Glucose: 87 mg/dL (ref 70–99)
Potassium: 4.3 mmol/L (ref 3.5–5.2)
Sodium: 139 mmol/L (ref 134–144)
Total Protein: 7.2 g/dL (ref 6.0–8.5)
eGFR: 103 mL/min/{1.73_m2} (ref >59)

## 2021-07-10 ENCOUNTER — Other Ambulatory Visit: Payer: BC Managed Care – PPO

## 2021-07-13 ENCOUNTER — Ambulatory Visit
Admission: RE | Admit: 2021-07-13 | Discharge: 2021-07-13 | Disposition: A | Discharge status: Home / Self Care | Payer: BC Managed Care – PPO | Source: Ambulatory Visit | Attending: Gastroenterology | Admitting: Gastroenterology

## 2021-07-13 ENCOUNTER — Encounter: Payer: Self-pay | Admitting: Gastroenterology

## 2021-07-13 ENCOUNTER — Other Ambulatory Visit: Payer: Self-pay

## 2021-07-13 DIAGNOSIS — R1011 Right upper quadrant pain: Secondary | ICD-10-CM

## 2021-07-13 MED ORDER — TECHNETIUM TC 99M MEBROFENIN IV KIT
5.1000 | PACK | Freq: Once | INTRAVENOUS | Status: AC | PRN
  Administered 2021-07-13: 5.1 via INTRAVENOUS

## 2021-07-14 ENCOUNTER — Encounter
Admission: RE | Disposition: A | Discharge status: Home / Self Care | Payer: Self-pay | Source: Home / Self Care | Attending: Gastroenterology

## 2021-07-14 ENCOUNTER — Encounter: Payer: Self-pay | Admitting: Gastroenterology

## 2021-07-14 ENCOUNTER — Ambulatory Visit
Admission: RE | Admit: 2021-07-14 | Discharge: 2021-07-14 | Disposition: A | Discharge status: Home / Self Care | Payer: BC Managed Care – PPO | Attending: Gastroenterology | Admitting: Gastroenterology

## 2021-07-14 ENCOUNTER — Ambulatory Visit: Payer: BC Managed Care – PPO | Admitting: Registered Nurse

## 2021-07-14 ENCOUNTER — Telehealth: Payer: Self-pay

## 2021-07-14 DIAGNOSIS — D122 Benign neoplasm of ascending colon: Secondary | ICD-10-CM | POA: Diagnosis not present

## 2021-07-14 DIAGNOSIS — K635 Polyp of colon: Secondary | ICD-10-CM

## 2021-07-14 DIAGNOSIS — Z85820 Personal history of malignant melanoma of skin: Secondary | ICD-10-CM | POA: Diagnosis not present

## 2021-07-14 DIAGNOSIS — K640 First degree hemorrhoids: Secondary | ICD-10-CM | POA: Insufficient documentation

## 2021-07-14 DIAGNOSIS — Z1211 Encounter for screening for malignant neoplasm of colon: Secondary | ICD-10-CM | POA: Diagnosis present

## 2021-07-14 HISTORY — PX: COLONOSCOPY WITH PROPOFOL: SHX5780

## 2021-07-14 SURGERY — COLONOSCOPY WITH PROPOFOL
Anesthesia: General

## 2021-07-14 MED ORDER — SODIUM CHLORIDE 0.9 % IV SOLN: INTRAVENOUS | Status: DC

## 2021-07-14 MED ORDER — PROPOFOL 500 MG/50ML IV EMUL
INTRAVENOUS | Status: DC | PRN
  Administered 2021-07-14: 150 ug/kg/min via INTRAVENOUS

## 2021-07-14 MED ORDER — LIDOCAINE HCL (CARDIAC) PF 100 MG/5ML IV SOSY
PREFILLED_SYRINGE | INTRAVENOUS | Status: DC | PRN
Start: 2021-07-14 — End: 2021-07-14
  Administered 2021-07-14: 100 mg via INTRAVENOUS

## 2021-07-14 MED ORDER — PROPOFOL 10 MG/ML IV BOLUS
INTRAVENOUS | Status: DC | PRN
  Administered 2021-07-14: 70 mg via INTRAVENOUS

## 2021-07-14 NOTE — Anesthesia Preprocedure Evaluation (Signed)
Anesthesia Evaluation  Patient identified by MRN, date of birth, ID band Patient awake    Reviewed: Allergy & Precautions, NPO status , Patient's Chart, lab work & pertinent test results  History of Anesthesia Complications Negative for: history of anesthetic complications  Airway Mallampati: III  TM Distance: >3 FB Neck ROM: full    Dental  (+) Chipped   Pulmonary neg pulmonary ROS, neg shortness of breath,    Pulmonary exam normal        Cardiovascular (-) Past MI negative cardio ROS Normal cardiovascular exam     Neuro/Psych  Headaches, negative psych ROS   GI/Hepatic negative GI ROS, Neg liver ROS, neg GERD  ,  Endo/Other  negative endocrine ROS  Renal/GU negative Renal ROS  negative genitourinary   Musculoskeletal   Abdominal   Peds  Hematology negative hematology ROS (+)   Anesthesia Other Findings Past Medical History: No date: Endometriosis No date: Melanoma ([place])     Comment:  melanoma leg No date: Migraines No date: Sinusitis No date: Vitamin D deficiency  Past Surgical History: 2008: ABDOMINAL HYSTERECTOMY No date: DILATION AND CURETTAGE OF UTERUS No date: endrometrial surgery No date: FINGER SURGERY     Comment:  11/2015 neg No date: JOINT REPLACEMENT 2007: LAPAROSCOPY No date: SHOULDER ARTHROSCOPY W/ ROTATOR CUFF REPAIR; Right 1991: TUBAL LIGATION  BMI    Body Mass Index: 29.63 kg/m      Reproductive/Obstetrics negative OB ROS                             Anesthesia Physical Anesthesia Plan  ASA: 2  Anesthesia Plan: General   Post-op Pain Management:    Induction: Intravenous  PONV Risk Score and Plan: Propofol infusion and TIVA  Airway Management Planned: Natural Airway and Nasal Cannula  Additional Equipment:   Intra-op Plan:   Post-operative Plan:   Informed Consent: I have reviewed the patients History and Physical, chart, labs and  discussed the procedure including the risks, benefits and alternatives for the proposed anesthesia with the patient or authorized representative who has indicated his/her understanding and acceptance.     Dental Advisory Given  Plan Discussed with: Anesthesiologist, CRNA and Surgeon  Anesthesia Plan Comments: (Patient consented for risks of anesthesia including but not limited to:  - adverse reactions to medications - risk of airway placement if required - damage to eyes, teeth, lips or other oral mucosa - nerve damage due to positioning  - sore throat or hoarseness - Damage to heart, brain, nerves, lungs, other parts of body or loss of life  Patient voiced understanding.)        Anesthesia Quick Evaluation

## 2021-07-14 NOTE — Op Note (Signed)
[facility] Gastroenterology Patient Name: Kristie Hall Procedure Date: 07/14/2021 8:23 AM MRN: [id number] Account #: [id number] Date of Birth: May 26, 1961 Admit Type: Outpatient Age: 61 Room: [facility] ENDO ROOM 2 Gender: Female Note Status: Finalized Instrument Name: Jasper Riling [id number] Procedure:             Colonoscopy Indications:           Screening for colorectal malignant neoplasm Providers:             Jonathon Bellows MD, MD Referring MD:          No Local Md, MD (Referring MD) Medicines:             Monitored Anesthesia Care Complications:         No immediate complications. Procedure:             Pre-Anesthesia Assessment:                        - Prior to the procedure, a History and Physical was                         performed, and patient medications, allergies and                         sensitivities were reviewed. The patient's tolerance                         of previous anesthesia was reviewed.                        - The risks and benefits of the procedure and the                         sedation options and risks were discussed with the                         patient. All questions were answered and informed                         consent was obtained.                        - ASA Grade Assessment: II - A patient with mild                         systemic disease.                        After obtaining informed consent, the colonoscope was                         passed under direct vision. Throughout the procedure,                         the patient's blood pressure, pulse, and oxygen                         saturations were monitored continuously. The                         Colonoscope was  introduced through the anus and                         advanced to the the cecum, identified by the                         appendiceal orifice. The colonoscopy was performed                         with ease. The patient tolerated the procedure well.                          The quality of the bowel preparation was good. Findings:      The perianal and digital rectal examinations were normal.      Non-bleeding internal hemorrhoids were found during retroflexion. The       hemorrhoids were large and Grade I (internal hemorrhoids that do not       prolapse).      A 5 mm polyp was found in the ascending colon. The polyp was sessile.       The polyp was removed with a cold snare. Resection and retrieval were       complete.      The exam was otherwise without abnormality on direct and retroflexion       views. Impression:            - Non-bleeding internal hemorrhoids.                        - One 5 mm polyp in the ascending colon, removed with                         a cold snare. Resected and retrieved.                        - The examination was otherwise normal on direct and                         retroflexion views. Recommendation:        - Discharge patient to home (with escort).                        - Resume previous diet.                        - Continue present medications.                        - Discharge patient to home (with escort).                        - Resume previous diet.                        - Continue present medications.                        - Await pathology results.                        - Repeat colonoscopy for surveillance based on  pathology results.                        - Return to GI office in 2 weeks. Procedure Code(s):     --- Professional ---                        [phone], Colonoscopy, flexible; with removal of                         tumor(s), polyp(s), or other lesion(s) by snare                         technique Diagnosis Code(s):     --- Professional ---                        Z12.11, Encounter for screening for malignant neoplasm                         of colon                        K63.5, Polyp of colon                        K64.0, First degree hemorrhoids CPT  copyright 2019 American Medical Association. All rights reserved. The codes documented in this report are preliminary and upon coder review may  be revised to meet current compliance requirements. Jonathon Bellows, MD Jonathon Bellows MD, MD 07/14/2021 8:44:26 AM This report has been signed electronically. Number of Addenda: 0 Note Initiated On: 07/14/2021 8:23 AM Scope Withdrawal Time: 0 hours 12 minutes 20 seconds  Total Procedure Duration: 0 hours 15 minutes 54 seconds  Estimated Blood Loss:  Estimated blood loss: none.      [facility]

## 2021-07-14 NOTE — Transfer of Care (Signed)
Immediate Anesthesia Transfer of Care Note  Patient: Kristie Hall  Procedure(s) Performed: COLONOSCOPY WITH PROPOFOL  Patient Location: Endoscopy Unit  Anesthesia Type:General  Level of Consciousness: drowsy  Airway & Oxygen Therapy: Patient Spontanous Breathing  Post-op Assessment: Report given to RN and Post -op Vital signs reviewed and stable  Post vital signs: Reviewed and stable  Last Vitals: see Epic EMR and Flowsheets Vitals Value Taken Time  BP    Temp    Pulse    Resp    SpO2      Last Pain:  Vitals:   07/14/21 0742  TempSrc: Temporal         Complications: No notable events documented.

## 2021-07-14 NOTE — Anesthesia Postprocedure Evaluation (Signed)
Anesthesia Post Note  Patient: Kristie Hall  Procedure(s) Performed: COLONOSCOPY WITH PROPOFOL  Patient location during evaluation: Endoscopy Anesthesia Type: General Level of consciousness: awake and alert Pain management: pain level controlled Vital Signs Assessment: post-procedure vital signs reviewed and stable Respiratory status: spontaneous breathing, nonlabored ventilation, respiratory function stable and patient connected to nasal cannula oxygen Cardiovascular status: blood pressure returned to baseline and stable Postop Assessment: no apparent nausea or vomiting Anesthetic complications: no   No notable events documented.   Last Vitals:  Vitals:   07/14/21 0907 07/14/21 0917  BP: 123/72 115/75  Pulse: 65 70  Resp: 11 17  Temp:    SpO2: 99% 98%    Last Pain:  Vitals:   07/14/21 0917  TempSrc:   PainSc: 0-No pain                 Precious Haws Budd [place]

## 2021-07-14 NOTE — Telephone Encounter (Signed)
Patient was called per Dr. Georgeann Oppenheim request to schedule her a sooner appointment with him since she is needing a hemorrhoid banding. However, patient stated that she would have to call us back since 07/21/2021 would not be a good day for her. I told her that I would leave that date until she calls me back to tell me different once she goes home and check her calendar per patient's request.

## 2021-07-14 NOTE — H&P (Signed)
Jonathon Bellows, MD [address], [place], [place], [place], [id number], [place], [place], [place], [address] Phone: [phone]  Fax: [phone]  Primary Care Physician:  Philip Aspen, CNM   Pre-Procedure History & Physical: HPI:  Kristie Hall is a 61 y.o. female is here for an colonoscopy.   Past Medical History:  Diagnosis Date   Endometriosis    Melanoma ([place])    melanoma leg   Migraines    Sinusitis    Vitamin D deficiency     Past Surgical History:  Procedure Laterality Date   ABDOMINAL HYSTERECTOMY  2008   DILATION AND CURETTAGE OF UTERUS     endrometrial surgery     FINGER SURGERY     11/2015 neg   JOINT REPLACEMENT     LAPAROSCOPY  2007   SHOULDER ARTHROSCOPY W/ ROTATOR CUFF REPAIR Right    TUBAL LIGATION  1991    Prior to Admission medications   Medication Sig Start Date End Date Taking? Authorizing Provider  dicyclomine (BENTYL) 10 MG capsule Take 1 capsule (10 mg total) by mouth 3 (three) times daily as needed for spasms. Patient not taking: Reported on 07/14/2021 06/29/21   Jonathon Bellows, MD  ketoprofen (ORUDIS) 50 MG capsule Take 1 capsule (50 mg total) by mouth 4 (four) times daily as needed. Patient not taking: Reported on 06/29/2021 07/29/20   Philip Aspen, CNM  rizatriptan (MAXALT) 5 MG tablet TAKE 1 TABLET BY MOUTH AS NEEDED FOR MIGRAINE. MAY REPEAT IN 2 HOURS IF NEEDED Patient not taking: Reported on 06/29/2021 09/22/20   Philip Aspen, CNM    Allergies as of 06/30/2021 - Review Complete 06/29/2021  Allergen Reaction Noted   Imitrex [sumatriptan] Shortness Of Breath 05/23/2015    Family History  Problem Relation Age of Onset   Diabetes Father    Heart disease Father    Breast cancer Sister 64   Ovarian cancer Neg Hx    Colon cancer Neg Hx     Social History   Socioeconomic History   Marital status: Married    Spouse name: Not on file   Number of children: Not on file   Years of education: Not on file   Highest  education level: Not on file  Occupational History   Not on file  Tobacco Use   Smoking status: Never   Smokeless tobacco: Never  Vaping Use   Vaping Use: Never used  Substance and Sexual Activity   Alcohol use: Yes    Comment: occas   Drug use: No   Sexual activity: Not Currently    Birth control/protection: Surgical  Other Topics Concern   Not on file  Social History Narrative   Not on file   Social Determinants of Health   Financial Resource Strain: Not on file  Food Insecurity: Not on file  Transportation Needs: Not on file  Physical Activity: Not on file  Stress: Not on file  Social Connections: Not on file  Intimate Partner Violence: Not on file    Review of Systems: See HPI, otherwise negative ROS  Physical Exam: BP 112/73    Pulse 80    Temp 97.6 F (36.4 C) (Temporal)    Resp 14    Ht 5\' 2"  (1.575 m)    Wt 73.5 kg    SpO2 97%    BMI 29.63 kg/m  General:   Alert,  pleasant and cooperative in NAD Head:  Normocephalic and atraumatic. Neck:  Supple; no  masses or thyromegaly. Lungs:  Clear throughout to auscultation, normal respiratory effort.    Heart:  +S1, +S2, Regular rate and rhythm, No edema. Abdomen:  Soft, nontender and nondistended. Normal bowel sounds, without guarding, and without rebound.   Neurologic:  Alert and  oriented x4;  grossly normal neurologically.  Impression/Plan: Kristie Hall is here for an colonoscopy to be performed for Screening colonoscopy average risk   Risks, benefits, limitations, and alternatives regarding  colonoscopy have been reviewed with the patient.  Questions have been answered.  All parties agreeable.   Jonathon Bellows, MD  07/14/2021, 8:54 AM

## 2021-07-15 ENCOUNTER — Encounter: Payer: Self-pay | Admitting: Gastroenterology

## 2021-07-15 LAB — SURGICAL PATHOLOGY

## 2021-07-16 ENCOUNTER — Encounter: Payer: Self-pay | Admitting: Gastroenterology

## 2021-07-16 ENCOUNTER — Other Ambulatory Visit: Payer: Self-pay

## 2021-07-16 ENCOUNTER — Ambulatory Visit
Admission: RE | Admit: 2021-07-16 | Discharge: 2021-07-16 | Disposition: A | Discharge status: Home / Self Care | Payer: BC Managed Care – PPO | Source: Ambulatory Visit | Attending: Certified Nurse Midwife | Admitting: Certified Nurse Midwife

## 2021-07-16 DIAGNOSIS — Z01419 Encounter for gynecological examination (general) (routine) without abnormal findings: Secondary | ICD-10-CM | POA: Insufficient documentation

## 2021-07-16 DIAGNOSIS — Z1231 Encounter for screening mammogram for malignant neoplasm of breast: Secondary | ICD-10-CM | POA: Diagnosis present

## 2021-07-21 ENCOUNTER — Encounter: Payer: Self-pay | Admitting: Gastroenterology

## 2021-07-21 ENCOUNTER — Ambulatory Visit (INDEPENDENT_AMBULATORY_CARE_PROVIDER_SITE_OTHER): Payer: BC Managed Care – PPO | Admitting: Gastroenterology

## 2021-07-21 ENCOUNTER — Other Ambulatory Visit: Payer: Self-pay

## 2021-07-21 VITALS — BP 97/64 | HR 84 | Ht 67.0 in | Wt 170.2 lb

## 2021-07-21 DIAGNOSIS — K648 Other hemorrhoids: Secondary | ICD-10-CM

## 2021-07-21 NOTE — Progress Notes (Signed)
Patient follow-ups today for banding of hemorrhoids    Summary of history :  Initially seen on 06/29/2021 for right upper quadrant pain which was felt to be having some component of biliary in nature.  I also colonoscopy for colon cancer screening.  I performed a right upper quadrant ultrasound that was normal subsequently she had a HIDA scan which also showed normal patent cystic and common bile ducts.  On 07/14/2021 I performed a colonoscopy found large internal hemorrhoids and a 5 mm polyp in the ascending colon.  The polyp was a tubular adenoma.    Interval history    Continues to have on and off bleeding  Chaperone Ginger present in the room Digital rectal exam performed in the presence of a chaperone. External anal findings: Skin tags noted Internal findings: , No masses, no blood on glove noticed.    PROCEDURE NOTE: The patient presents with symptomatic grade 2 hemorrhoids, unresponsive to maximal medical therapy, requesting rubber band ligation of his/her hemorrhoidal disease.  All risks, benefits and alternative forms of therapy were described and informed consent was obtained.  In the Left Lateral Decubitus position (if anoscopy is performed) anoscopic examination revealed grade 2 hemorrhoids in the all position(s).   The decision was made to band the RA internal hemorrhoid, and the Kramer was used to perform band ligation without complication.  Digital anorectal examination was then performed to assure proper positioning of the band, and to adjust the banded tissue as required.  The patient initially had some pain and had to loosen the band to the point that he actually fell off.  At that point the patient had no pain attempted to repair on the left lateral column with good entrapment of the hemorrhoid again the patient felt very uncomfortable and requested to loosen the band that I did significantly and the discomfort resolved.  Unclear how much of the band was  retained the patient was discharged home without pain or other issues.  Dietary and behavioral recommendations were given and (if necessary - prescriptions were given), along with follow-up instructions.  The patient will return 4 weeks for follow-up and possible additional banding as required.  No complications were encountered and the patient tolerated the procedure well.   Plan:  Avoid constipation.  Commence on stool softeners if not already on Still has right upper quadrant pain on and off explained that GI evaluation has been negative I could refer her to a surgeon to decide if a cholecystectomy would help in her  Follow-up:4 weeks  Dr Jonathon Bellows MD,MRCP [facility]) Gastroenterology/Hepatology Pager: [phone]

## 2021-07-22 ENCOUNTER — Other Ambulatory Visit: Payer: Self-pay | Admitting: Gastroenterology

## 2021-07-24 ENCOUNTER — Other Ambulatory Visit: Payer: Self-pay

## 2021-07-24 DIAGNOSIS — R1011 Right upper quadrant pain: Secondary | ICD-10-CM

## 2021-07-28 ENCOUNTER — Other Ambulatory Visit: Payer: Self-pay | Admitting: Gastroenterology

## 2021-07-29 ENCOUNTER — Other Ambulatory Visit: Payer: Self-pay

## 2021-07-29 ENCOUNTER — Encounter: Payer: Self-pay | Admitting: Surgery

## 2021-07-29 ENCOUNTER — Ambulatory Visit (INDEPENDENT_AMBULATORY_CARE_PROVIDER_SITE_OTHER): Payer: BC Managed Care – PPO | Admitting: Surgery

## 2021-07-29 VITALS — BP 131/83 | HR 87 | Temp 98.4°F | Ht 67.0 in | Wt 169.6 lb

## 2021-07-29 DIAGNOSIS — R1011 Right upper quadrant pain: Secondary | ICD-10-CM

## 2021-07-29 DIAGNOSIS — R0789 Other chest pain: Secondary | ICD-10-CM | POA: Diagnosis not present

## 2021-07-29 MED ORDER — TRIAMCINOLONE ACETONIDE 40 MG/ML IJ SUSP
40.0000 mg | Freq: Once | INTRAMUSCULAR | Status: AC
  Administered 2021-07-29: 40 mg via INTRAMUSCULAR

## 2021-07-29 NOTE — Patient Instructions (Addendum)
Please call the office with any questions or concerns. Please see your next appointment listed below.

## 2021-07-30 ENCOUNTER — Telehealth: Payer: Self-pay | Admitting: *Deleted

## 2021-07-30 DIAGNOSIS — R1011 Right upper quadrant pain: Secondary | ICD-10-CM

## 2021-07-30 NOTE — Progress Notes (Signed)
Patient ID: Kristie Hall, female   DOB: 17-May-1961, 61 y.o.   MRN: [id number]  HPI Kristie Hall is a 61 y.o. female seen in consultation at the request of Dr. Vicente Males.  She has chronic right upper quadrant pain and right-sided chest wall pain.  She reports that this specifically get worse after eating about half an hour especially with fatty meals.  Pain is colicky intermittent and lasts for 15 to 20 minutes.  Now she has crescendo symptoms and worsening of her pain for the last few weeks.  She has been seen by Dr. Vicente Males from GI and ultrasound as well as HIDA scan has been performed.  I have personally reviewed the images showing no evidence of gallbladder disease or stones.  Normal ejection fraction GB. She also has had upper and lower scopes by Dr. Vicente Males that there were unrevealing.  Please also note that I have personally reviewed the images. She is not desperate given her persistent pain.  She also reports that sometimes the pain is alleviated when she presses underneath the right rib cage. She is able to perform more than 4 METS of activity without shortness of breath or chest pain.  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 Her daughter had somewhat similar symptoms and she did have cholecystectomy with good results. She has had a recent CBC and CMP that were completely normal.  She has also been treated for IBS w Bentyl w/o any improvement.   Prior hx of abd hysterectomy. Reviewing records she did have SBO 2009. HPI  Past Medical History:  Diagnosis Date   Endometriosis    Melanoma ([place])    melanoma leg   Migraines    Sinusitis    Vitamin D deficiency     Past Surgical History:  Procedure Laterality Date   ABDOMINAL HYSTERECTOMY  2008   COLONOSCOPY WITH PROPOFOL N/A 07/14/2021   Procedure: COLONOSCOPY WITH PROPOFOL;  Surgeon: Jonathon Bellows, MD;  Location: [facility] ENDOSCOPY;  Service: Gastroenterology;  Laterality: N/A;   DILATION AND CURETTAGE OF UTERUS     endrometrial surgery      FINGER SURGERY     11/2015 neg   JOINT REPLACEMENT     LAPAROSCOPY  2007   SHOULDER ARTHROSCOPY W/ ROTATOR CUFF REPAIR Right    TUBAL LIGATION  1991    Family History  Problem Relation Age of Onset   Diabetes Father    Heart disease Father    Breast cancer Sister 66   Ovarian cancer Neg Hx    Colon cancer Neg Hx     Social History Social History   Tobacco Use   Smoking status: Never   Smokeless tobacco: Never  Vaping Use   Vaping Use: Never used  Substance Use Topics   Alcohol use: Yes    Comment: occas   Drug use: No    Allergies  Allergen Reactions   Imitrex [Sumatriptan] Shortness Of Breath    Current Outpatient Medications  Medication Sig Dispense Refill   dicyclomine (BENTYL) 10 MG capsule TAKE 1 CAPSULE (10 MG TOTAL) BY MOUTH 3 (THREE) TIMES DAILY AS NEEDED FOR SPASMS. 270 capsule 1   ketoprofen (ORUDIS) 50 MG capsule Take 1 capsule (50 mg total) by mouth 4 (four) times daily as needed. 30 capsule 0   rizatriptan (MAXALT) 5 MG tablet TAKE 1 TABLET BY MOUTH AS NEEDED FOR MIGRAINE. MAY REPEAT IN 2 HOURS IF NEEDED 10 tablet 1   No current facility-administered medications for this visit.  Review of Systems Full ROS  was asked and was negative except for the information on the HPI  Physical Exam Blood pressure 131/83, pulse 87, temperature 98.4 F (36.9 C), temperature source Oral, height 5\' 7"  (1.702 m), weight 169 lb 9.6 oz (76.9 kg), SpO2 96 %. CONSTITUTIONAL: NAD. EYES: Pupils are equal, round, and, Sclera are non-icteric. EARS, NOSE, MOUTH AND THROAT: wearing a mask. Hearing is intact to voice. LYMPH NODES:  Lymph nodes in the neck are normal. RESPIRATORY:  Lungs are clear. There is normal respiratory effort, with equal breath sounds bilaterally, and without pathologic use of accessory muscles. CARDIOVASCULAR: Heart is regular without murmurs, gallops, or rubs. Tender on lower right chest wall and ribs GI: The abdomen is  soft, Tender RUq w/o  peritonitis or Murphy, and nondistended. There are no palpable masses. There is no hepatosplenomegaly. There are normal bowel sounds in all quadrants. GU: Rectal deferred.   MUSCULOSKELETAL: Normal muscle strength and tone. No cyanosis or edema.   SKIN: Turgor is good and there are no pathologic skin lesions or ulcers. NEUROLOGIC: Motor and sensation is grossly normal. Cranial nerves are grossly intact. PSYCH:  Oriented to person, place and time. Affect is normal.  Data Reviewed  I have personally reviewed the patient's imaging, laboratory findings and medical records.    Assessment/Plan  61 year old female with right upper quadrant pine and right chest wall pain.  Differential will certainly include biliary colic versus intercostal neuralgia.  Unfortunately all imaging and functional status of the gallbladder have turned to be negative.  I had an extensive discussion with the patient regarding her disease process.  Options of further evaluation by GI versus potential treatment of intercostal neuralgia with multiple intercostal nerve blocks.  Another option will be to proceed with cholecystectomy.  I am a bit hesitant about th the latter option as it is more invasive.  After an extensive discussion with the patient she has agreed to start with intercostal nerve block this will be both diagnostic and therapeutic as if intercostal neuralgia is the case symptoms should resolve and last for couple of days.  I still cannot explain why her pain is exacerbated by meals. Please note that I spent at least 60 minutes in this encounter including coordination of her care, personally reviewing records and imaging studies, placing orders, providing extensive counseling and performing appropriate documentation A copy of this report was sent to the referring provider. May need to do CT scan or even enterography for further w/u if pain persists. Cholecystectomy may be indicated also if nothing else is identifiable  , she is aware that there would be no warranties and it will be a diagnosis of exclusion.   PROCEDURE NOTE 1. Intercostal nerve block Right, 7th, 8th and 9th IC spaces  Anesthesia: Lidocaine1% and marcaine .25% w epi ( 20cc volume) + kenalog 40mg  After informed consent was obtained the patient was placed in supine position.  Right chest was prepped and draped in the usual sterile fashion.  We identified the seventh eighth and ninth rib and with palpation were able to inject the periosteum of the seventh eighth and ninth intercostal spaces.  No complications.  Patient did experience some relief of her right-sided chest pain but it was not dramatic.  Caroleen Hamman, MD FACS General Surgeon 07/30/2021, 9:44 PM

## 2021-07-30 NOTE — Telephone Encounter (Signed)
Patient called and stated that she was suppose to call you to let you know about the nerve block. She stated that it did not work at all, that she is still hurting in the same places. She would like to proceed with surgery

## 2021-07-31 ENCOUNTER — Ambulatory Visit
Admission: RE | Admit: 2021-07-31 | Discharge: 2021-07-31 | Disposition: A | Discharge status: Home / Self Care | Payer: BC Managed Care – PPO | Source: Ambulatory Visit | Attending: Surgery | Admitting: Surgery

## 2021-07-31 ENCOUNTER — Other Ambulatory Visit: Payer: Self-pay

## 2021-07-31 DIAGNOSIS — R1011 Right upper quadrant pain: Secondary | ICD-10-CM

## 2021-07-31 MED ORDER — IOHEXOL 300 MG/ML  SOLN
100.0000 mL | Freq: Once | INTRAMUSCULAR | Status: AC | PRN
  Administered 2021-07-31: 100 mL via INTRAVENOUS

## 2021-07-31 NOTE — Telephone Encounter (Signed)
CT scan scheduled 01/28/22 @ 10 am.  Follow up with Dr.Pabon 08/31/21.

## 2021-08-03 ENCOUNTER — Ambulatory Visit: Payer: BC Managed Care – PPO | Admitting: Surgery

## 2021-08-03 ENCOUNTER — Encounter: Payer: BC Managed Care – PPO | Admitting: Certified Nurse Midwife

## 2021-08-03 ENCOUNTER — Telehealth: Payer: Self-pay | Admitting: Surgery

## 2021-08-03 NOTE — Telephone Encounter (Signed)
Patient has been advised of Pre-Admission date/time, COVID Testing date and Surgery date.  Surgery Date: 08/13/21 Preadmission Testing Date: 08/06/21 (phone 1p-5p) Covid Testing Date: Not needed.    Patient has been made aware to call [phone], between 1-3:00pm the day before surgery, to find out what time to arrive for surgery.

## 2021-08-04 ENCOUNTER — Ambulatory Visit (INDEPENDENT_AMBULATORY_CARE_PROVIDER_SITE_OTHER): Payer: BC Managed Care – PPO | Admitting: Obstetrics and Gynecology

## 2021-08-04 ENCOUNTER — Encounter: Payer: Self-pay | Admitting: Obstetrics and Gynecology

## 2021-08-04 ENCOUNTER — Other Ambulatory Visit: Payer: Self-pay

## 2021-08-04 VITALS — BP 134/64 | HR 70 | Resp 16 | Ht 67.0 in | Wt 170.3 lb

## 2021-08-04 DIAGNOSIS — Z01419 Encounter for gynecological examination (general) (routine) without abnormal findings: Secondary | ICD-10-CM | POA: Diagnosis not present

## 2021-08-04 DIAGNOSIS — Z1231 Encounter for screening mammogram for malignant neoplasm of breast: Secondary | ICD-10-CM

## 2021-08-04 NOTE — Progress Notes (Signed)
ANNUAL PREVENTATIVE CARE GYNECOLOGY  ENCOUNTER NOTE  Subjective:       Kristie Hall is a 61 y.o. [phone] female here for a routine annual gynecologic exam. The patient is sexually active. The patient is not taking hormone replacement therapy. Patient denies post-menopausal vaginal bleeding. The patient wears seatbelts: yes. The patient participates in regular exercise: no. Has the patient ever been transfused or tattooed?: no. The patient reports that there is not domestic violence in her life.   Of note, patient reports that since she was last seen, she has been seen GI and undergone full evaluation of her upper abdominal and side pain.  Has had injections to r/o musculoskeletal, colonoscopy, and HIDA scan.  Has plans to undergo gallbladder removal next week.  She also reports that she has had banding for her hemorrhoids.    Gynecologic History No LMP recorded. Patient has had a hysterectomy. Contraception: status post hysterectomy Last Pap: 04/01/2017. Results were: normal Last mammogram: 07/16/2021. Results were: normal Last Colonoscopy: 07/14/2021: Return to GI in 2 weeks Last Dexa Scan: has never had one.    Obstetric History OB History  Gravida Para Term Preterm AB Living  3 2 2   1 2   SAB IAB Ectopic Multiple Live Births  1       2    # Outcome Date GA Lbr Len/2nd Weight Sex Delivery Anes PTL Lv  3 Term 1991   10 lb 1.6 oz (4.581 kg)  Vag-Spont   LIV  2 Term 1989   7 lb 2.4 oz (3.243 kg) F Vag-Spont   LIV  1 SAB 1984            Past Medical History:  Diagnosis Date   Endometriosis    Melanoma ([place])    melanoma leg   Migraines    Sinusitis    Vitamin D deficiency     Family History  Problem Relation Age of Onset   Diabetes Father    Heart disease Father    Breast cancer Sister 30   Ovarian cancer Neg Hx    Colon cancer Neg Hx     Past Surgical History:  Procedure Laterality Date   ABDOMINAL HYSTERECTOMY  2008   COLONOSCOPY WITH PROPOFOL N/A 07/14/2021    Procedure: COLONOSCOPY WITH PROPOFOL;  Surgeon: Jonathon Bellows, MD;  Location[facility] ENDOSCOPY;  Service: Gastroenterology;  Laterality: N/A;   DILATION AND CURETTAGE OF UTERUS     endrometrial surgery     FINGER SURGERY     11/2015 neg   JOINT REPLACEMENT     LAPAROSCOPY  2007   SHOULDER ARTHROSCOPY W/ ROTATOR CUFF REPAIR Right    TUBAL LIGATION  1991    Social History   Socioeconomic History   Marital status: Married    Spouse name: Not on file   Number of children: Not on file   Years of education: Not on file   Highest education level: Not on file  Occupational History   Not on file  Tobacco Use   Smoking status: Never   Smokeless tobacco: Never  Vaping Use   Vaping Use: Never used  Substance and Sexual Activity   Alcohol use: Yes    Comment: occas   Drug use: No   Sexual activity: Not Currently    Birth control/protection: Surgical  Other Topics Concern   Not on file  Social History Narrative   Not on file   Social Determinants of Health   Financial Resource Strain: Not  on file  Food Insecurity: Not on file  Transportation Needs: Not on file  Physical Activity: Not on file  Stress: Not on file  Social Connections: Not on file  Intimate Partner Violence: Not on file    Current Outpatient Medications on File Prior to Visit  Medication Sig Dispense Refill   ketoprofen (ORUDIS) 50 MG capsule Take 1 capsule (50 mg total) by mouth 4 (four) times daily as needed. 30 capsule 0   rizatriptan (MAXALT) 5 MG tablet TAKE 1 TABLET BY MOUTH AS NEEDED FOR MIGRAINE. MAY REPEAT IN 2 HOURS IF NEEDED 10 tablet 1   No current facility-administered medications on file prior to visit.    Allergies  Allergen Reactions   Imitrex [Sumatriptan] Shortness Of Breath     Review of Systems ROS Review of Systems - General ROS: negative for - chills, fatigue, fever, hot flashes, night sweats, weight gain or weight loss Psychological ROS: negative for - anxiety, decreased libido,  depression, mood swings, physical abuse or sexual abuse Ophthalmic ROS: negative for - blurry vision, eye pain or loss of vision ENT ROS: negative for - headaches, hearing change, visual changes or vocal changes Allergy and Immunology ROS: negative for - hives, itchy/watery eyes or seasonal allergies Hematological and Lymphatic ROS: negative for - bleeding problems, bruising, swollen lymph nodes or weight loss Endocrine ROS: negative for - galactorrhea, hair pattern changes, hot flashes, malaise/lethargy, mood swings, palpitations, polydipsia/polyuria, skin changes, temperature intolerance or unexpected weight changes Breast ROS: negative for - new or changing breast lumps or nipple discharge Respiratory ROS: negative for - cough or shortness of breath Cardiovascular ROS: negative for - chest pain, irregular heartbeat, palpitations or shortness of breath Gastrointestinal ROS: no abdominal pain, change in bowel habits, or black or bloody stools Genito-Urinary ROS: no dysuria, trouble voiding, or hematuria Musculoskeletal ROS: negative for - joint pain or joint stiffness Neurological ROS: negative for - bowel and bladder control changes Dermatological ROS: negative for rash and skin lesion changes   Objective:   BP 134/64    Pulse 70    Resp 16    Ht 5\' 7"  (1.702 m)    Wt 170 lb 4.8 oz (77.2 kg)    BMI 26.67 kg/m  CONSTITUTIONAL: Well-developed, well-nourished female in no acute distress.  PSYCHIATRIC: Normal mood and affect. Normal behavior. Normal judgment and thought content. [place]: Alert and oriented to person, place, and time. Normal muscle tone coordination. No cranial nerve deficit noted. HENT:  Normocephalic, atraumatic, External right and left ear normal. Oropharynx is clear and moist EYES: Conjunctivae and EOM are normal. Pupils are equal, round, and reactive to light. No scleral icterus.  NECK: Normal range of motion, supple, no masses.  Normal thyroid.  SKIN: Skin is warm and  dry. No rash noted. Not diaphoretic. No erythema. No pallor. CARDIOVASCULAR: Normal heart rate noted, regular rhythm, no murmur. RESPIRATORY: Clear to auscultation bilaterally. Effort and breath sounds normal, no problems with respiration noted. BREASTS: Symmetric in size. No masses, skin changes, nipple drainage, or lymphadenopathy. ABDOMEN: Soft, normal bowel sounds, no distention noted.  No tenderness, rebound or guarding.  BLADDER: Normal PELVIC:  Bladder no bladder distension noted  Urethra: normal appearing urethra with no masses, tenderness or lesions  Vulva: normal appearing vulva with no masses, tenderness or lesions  Vagina: mildly atrophic vagina without discharge, no lesions  Cervix: surgically absent  Uterus: surgically absent  Adnexa: normal adnexa in size, nontender and no masses  RV: External Exam NormaI, No Rectal  Masses, and Normal Sphincter tone  MUSCULOSKELETAL: Normal range of motion. No tenderness.  No cyanosis, clubbing, or edema.  2+ distal pulses. LYMPHATIC: No Axillary, Supraclavicular, or Inguinal Adenopathy.   Labs: Lab Results  Component Value Date   WBC 9.3 05/23/2015   HGB 13.6 05/23/2015   HCT 42.1 05/23/2015   MCV 88.6 05/23/2015   PLT 318 05/23/2015    Lab Results  Component Value Date   CREATININE 0.60 06/29/2021   BUN 24 06/29/2021   NA 139 06/29/2021   K 4.3 06/29/2021   CL 102 06/29/2021   CO2 22 06/29/2021    Lab Results  Component Value Date   ALT 53 (H) 06/29/2021   AST 36 06/29/2021   ALKPHOS 223 (H) 06/29/2021   BILITOT 0.5 06/29/2021    Lab Results  Component Value Date   CHOL 230 (H) 10/07/2015   HDL 43 10/07/2015   LDLCALC 151 (H) 10/07/2015   TRIG 182 (H) 10/07/2015   CHOLHDL 5.3 (H) 10/07/2015    Lab Results  Component Value Date   TSH 0.696 04/21/2018    No results found for: HGBA1C   Assessment:   Annual gynecologic examination 61 y.o. Contraception: status post hysterectomy Overweight Vaginal  atrophy  Plan:  Pap: Not needed Mammogram: up to date and Ordered Colon screening:  up to datae.  Labs: None ordered. Patient will have labs drawn soon for surgery.  Routine preventative health maintenance measures emphasized: Exercise/Diet/Weight control, Tobacco Warnings, Alcohol/Substance use risks, Stress Management, and Safe Sex COVID Vaccination status: Declined Return to [place], MD [facility]

## 2021-08-04 NOTE — Patient Instructions (Signed)
Breast Self-Awareness °Breast self-awareness is knowing how your breasts look and feel. Doing breast self-awareness is important. It allows you to catch a breast problem early while it is still small and can be treated. All women should do breast self-awareness, including women who have had breast implants. Tell your doctor if you notice a change in your breasts. °What you need: °A mirror. °A well-lit room. °How to do a breast self-exam °A breast self-exam is one way to learn what is normal for your breasts and to check for changes. To do a breast self-exam: °Look for changes ° °Take off all the clothes above your waist. °Stand in front of a mirror in a room with good lighting. °Put your hands on your hips. °Push your hands down. °Look at your breasts and nipples in the mirror to see if one breast or nipple looks different from the other. Check to see if: °The shape of one breast is different. °The size of one breast is different. °There are wrinkles, dips, and bumps in one breast and not the other. °Look at each breast for changes in the skin, such as: °Redness. °Scaly areas. °Look for changes in your nipples, such as: °Liquid around the nipples. °Bleeding. °Dimpling. °Redness. °A change in where the nipples are. °Feel for changes ° °Lie on your back on the floor. °Feel each breast. To do this, follow these steps: °Pick a breast to feel. °Put the arm closest to that breast above your head. °Use your other arm to feel the nipple area of your breast. Feel the area with the pads of your three middle fingers by making small circles with your fingers. For the first circle, press lightly. For the second circle, press harder. For the third circle, press even harder. °Keep making circles with your fingers at the different pressures as you move down your breast. Stop when you feel your ribs. °Move your fingers a little toward the center of your body. °Start making circles with your fingers again, this time going up until  you reach your collarbone. °Keep making up-and-down circles until you reach your armpit. Remember to keep using the three pressures. °Feel the other breast in the same way. °Sit or stand in the tub or shower. °With soapy water on your skin, feel each breast the same way you did in step 2 when you were lying on the floor. °Write down what you find °Writing down what you find can help you remember what to tell your doctor. Write down: °What is normal for each breast. °Any changes you find in each breast, including: °The kind of changes you find. °Whether you have pain. °Size and location of any lumps. °When you last had your menstrual period. °General tips °Check your breasts every month. °If you are breastfeeding, the best time to check your breasts is after you feed your baby or after you use a breast pump. °If you get menstrual periods, the best time to check your breasts is 5-7 days after your menstrual period is over. °With time, you will become comfortable with the self-exam, and you will begin to know if there are changes in your breasts. °Contact a doctor if you: °See a change in the shape or size of your breasts or nipples. °See a change in the skin of your breast or nipples, such as red or scaly skin. °Have fluid coming from your nipples that is not normal. °Find a lump or thick area that was not there before. °Have pain in   your breasts. °Have any concerns about your breast health. °Summary °Breast self-awareness includes looking for changes in your breasts, as well as feeling for changes within your breasts. °Breast self-awareness should be done in front of a mirror in a well-lit room. °You should check your breasts every month. If you get menstrual periods, the best time to check your breasts is 5-7 days after your menstrual period is over. °Let your doctor know of any changes you see in your breasts, including changes in size, changes on the skin, pain or tenderness, or fluid from your nipples that is not  normal. °This information is not intended to replace advice given to you by your health care provider. Make sure you discuss any questions you have with your health care provider. °Document Revised: 01/10/2018 Document Reviewed: 01/10/2018 °Elsevier Patient Education © 2022 [employer] °Preventive Care 40-64 Years Old, Female °Preventive care refers to lifestyle choices and visits with your health care provider that can promote health and wellness. Preventive care visits are also called wellness exams. °What can I expect for my preventive care visit? °Counseling °Your health care provider may ask you questions about your: °Medical history, including: °Past medical problems. °Family medical history. °Pregnancy history. °Current health, including: °Menstrual cycle. °Method of birth control. °Emotional well-being. °Home life and relationship well-being. °Sexual activity and sexual health. °Lifestyle, including: °Alcohol, nicotine or tobacco, and drug use. °Access to firearms. °Diet, exercise, and sleep habits. °Work and work environment. °Sunscreen use. °Safety issues such as seatbelt and bike helmet use. °Physical exam °Your health care provider will check your: °Height and weight. These may be used to calculate your BMI (body mass index). BMI is a measurement that tells if you are at a healthy weight. °Waist circumference. This measures the distance around your waistline. This measurement also tells if you are at a healthy weight and may help predict your risk of certain diseases, such as type 2 diabetes and high blood pressure. °Heart rate and blood pressure. °Body temperature. °Skin for abnormal spots. °What immunizations do I need? °Vaccines are usually given at various ages, according to a schedule. Your health care provider will recommend vaccines for you based on your age, medical history, and lifestyle or other factors, such as travel or where you work. °What tests do I need? °Screening °Your health care  provider may recommend screening tests for certain conditions. This may include: °Lipid and cholesterol levels. °Diabetes screening. This is done by checking your blood sugar (glucose) after you have not eaten for a while (fasting). °Pelvic exam and Pap test. °Hepatitis B test. °Hepatitis C test. °HIV (human immunodeficiency virus) test. °STI (sexually transmitted infection) testing, if you are at risk. °Lung cancer screening. °Colorectal cancer screening. °Mammogram. Talk with your health care provider about when you should start having regular mammograms. This may depend on whether you have a family history of breast cancer. °BRCA-related cancer screening. This may be done if you have a family history of breast, ovarian, tubal, or peritoneal cancers. °Bone density scan. This is done to screen for osteoporosis. °Talk with your health care provider about your test results, treatment options, and if necessary, the need for more tests. °Follow these instructions at home: °Eating and drinking ° °Eat a diet that includes fresh fruits and vegetables, whole grains, lean protein, and low-fat dairy products. °Take vitamin and mineral supplements as recommended by your health care provider. °Do not drink alcohol if: °Your health care provider tells you not to drink. °You are pregnant,   may be pregnant, or are planning to become pregnant. °If you drink alcohol: °Limit how much you have to 0-1 drink a day. °Know how much alcohol is in your drink. In the [place] one drink equals one 12 oz bottle of beer (355 mL), one 5 oz glass of wine (148 mL), or one 1½ oz glass of hard liquor (44 mL). °Lifestyle °Brush your teeth every morning and night with fluoride toothpaste. Floss one time each day. °Exercise for at least 30 minutes 5 or more days each week. °Do not use any products that contain nicotine or tobacco. These products include cigarettes, chewing tobacco, and vaping devices, such as e-cigarettes. If you need help quitting, ask  your health care provider. °Do not use drugs. °If you are sexually active, practice safe sex. Use a condom or other form of protection to prevent STIs. °If you do not wish to become pregnant, use a form of birth control. If you plan to become pregnant, see your health care provider for a prepregnancy visit. °Take aspirin only as told by your health care provider. Make sure that you understand how much to take and what form to take. Work with your health care provider to find out whether it is safe and beneficial for you to take aspirin daily. °Find healthy ways to manage stress, such as: °Meditation, yoga, or listening to music. °Journaling. °Talking to a trusted person. °Spending time with friends and family. °Minimize exposure to UV radiation to reduce your risk of skin cancer. °Safety °Always wear your seat belt while driving or riding in a vehicle. °Do not drive: °If you have been drinking alcohol. Do not ride with someone who has been drinking. °When you are tired or distracted. °While texting. °If you have been using any mind-altering substances or drugs. °Wear a helmet and other protective equipment during sports activities. °If you have firearms in your house, make sure you follow all gun safety procedures. °Seek help if you have been physically or sexually abused. °What's next? °Visit your health care provider once a year for an annual wellness visit. °Ask your health care provider how often you should have your eyes and teeth checked. °Stay up to date on all vaccines. °This information is not intended to replace advice given to you by your health care provider. Make sure you discuss any questions you have with your health care provider. °Document Revised: 11/19/2020 Document Reviewed: 11/19/2020 °Elsevier Patient Education © 2022 [employer]. ° °

## 2021-08-05 ENCOUNTER — Ambulatory Visit (INDEPENDENT_AMBULATORY_CARE_PROVIDER_SITE_OTHER): Payer: BC Managed Care – PPO | Admitting: Surgery

## 2021-08-05 ENCOUNTER — Other Ambulatory Visit: Payer: Self-pay

## 2021-08-05 ENCOUNTER — Encounter: Payer: Self-pay | Admitting: Surgery

## 2021-08-05 VITALS — BP 133/85 | HR 76 | Temp 98.2°F | Ht 67.0 in | Wt 168.8 lb

## 2021-08-05 DIAGNOSIS — R1011 Right upper quadrant pain: Secondary | ICD-10-CM

## 2021-08-05 DIAGNOSIS — K8050 Calculus of bile duct without cholangitis or cholecystitis without obstruction: Secondary | ICD-10-CM

## 2021-08-05 NOTE — Progress Notes (Signed)
Outpatient Surgical Follow Up ? ?08/05/2021 ? ?Kristie Hall is an 61 y.o. female.  ? ?Chief Complaint  ?Patient presents with  ? Follow-up  ?  Abd pain  ? ? ?HPI: Kristie Hall is a 61 y.o. female seen f/u RUQ pain/She reports that this specifically get worse after eating about half an hour especially with fatty meals.  Pain is colicky intermittent and lasts for 15 to 20 minutes.  Now she has crescendo symptoms and worsening of her pain for the last few weeks.  She has been seen by Dr. Vicente Males from GI and ultrasound as well as HIDA scan has been performed.  I have personally reviewed the images showing no evidence of gallbladder disease or stones.  Normal ejection fraction GB. ?She also has had upper and lower scopes by Dr. Vicente Males that there were unrevealing.  Please also note that I have personally reviewed the images. ?She is desperate given her persistent pain.   ?Last week I performed an intercostal nerve block in the office with no improvement on pain.  That night she had a heavy meal and experiencing problems.  She also had a CT scan that I ordered and I have personally reviewed showing no evidence of any clear-cut intra-abdominal pathology. ? ? ?She is able to perform more than 4 METS of activity without shortness of breath or chest pain.  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 ?Her daughter had somewhat similar symptoms and she did have cholecystectomy with good results. ?She has had a recent CBC and CMP that were completely normal. ?   ?  ? ?Past Medical History:  ?Diagnosis Date  ? Endometriosis   ? Melanoma ([place])   ? melanoma leg  ? Migraines   ? Sinusitis   ? Vitamin D deficiency   ? ? ?Past Surgical History:  ?Procedure Laterality Date  ? ABDOMINAL HYSTERECTOMY  2008  ? COLONOSCOPY WITH PROPOFOL N/A 07/14/2021  ? Procedure: COLONOSCOPY WITH PROPOFOL;  Surgeon: Jonathon Bellows, MD;  Location: [facility] ENDOSCOPY;  Service: Gastroenterology;  Laterality: N/A;  ? DILATION AND CURETTAGE OF UTERUS    ?  endrometrial surgery    ? FINGER SURGERY    ? 11/2015 neg  ? JOINT REPLACEMENT    ? LAPAROSCOPY  2007  ? SHOULDER ARTHROSCOPY W/ ROTATOR CUFF REPAIR Right   ? TUBAL LIGATION  1991  ? ? ?Family History  ?Problem Relation Age of Onset  ? Diabetes Father   ? Heart disease Father   ? Breast cancer Sister 50  ? Ovarian cancer Neg Hx   ? Colon cancer Neg Hx   ? ? ?Social History:  reports that she has never smoked. She has never used smokeless tobacco. She reports current alcohol use. She reports that she does not use drugs. ? ?Allergies:  ?Allergies  ?Allergen Reactions  ? Imitrex [Sumatriptan] Shortness Of Breath  ? ? ?Medications reviewed. ? ? ? ?ROS ?Full ROS performed and is otherwise negative other than what is stated in HPI ? ? ?BP 133/85   Pulse 76   Temp 98.2 ?F (36.8 ?C) (Oral)   Ht 5\' 7"  (1.702 m)   Wt 168 lb 12.8 oz (76.6 kg)   SpO2 96%   BMI 26.44 kg/m?  ? ?Physical Exam ?Vitals and nursing note reviewed. Exam conducted with a chaperone present.  ?Constitutional:   ?   General: She is not in acute distress. ?   Appearance: Normal appearance. She is normal weight.  ?Eyes:  ?  General: No scleral icterus.    ?   Right eye: No discharge.     ?   Left eye: No discharge.  ?Cardiovascular:  ?   Rate and Rhythm: Normal rate and regular rhythm.  ?   Heart sounds: No murmur heard. ?Pulmonary:  ?   Effort: Pulmonary effort is normal. No respiratory distress.  ?   Breath sounds: Normal breath sounds. No stridor. No wheezing or rhonchi.  ?Abdominal:  ?   General: Abdomen is flat. There is no distension.  ?   Palpations: Abdomen is soft. There is no mass.  ?   Tenderness: There is no abdominal tenderness. There is no guarding or rebound.  ?   Hernia: No hernia is present.  ?Musculoskeletal:     ?   General: No swelling or tenderness. Normal range of motion.  ?   Cervical back: Normal range of motion. No rigidity or tenderness.  ?Lymphadenopathy:  ?   Cervical: No cervical adenopathy.  ?Skin: ?   General: Skin is  warm and dry.  ?   Capillary Refill: Capillary refill takes less than 2 seconds.  ?   Coloration: Skin is not jaundiced or pale.  ?Neurological:  ?   General: No focal deficit present.  ?   Mental Status: She is alert and oriented to person, place, and time.  ?Psychiatric:     ?   Mood and Affect: Mood normal.     ?   Behavior: Behavior normal.     ?   Thought Content: Thought content normal.     ?   Judgment: Judgment normal.  ? ? ? ? ?Assessment/Plan: ?61 year old female with persistent right upper quadrant pain worsening after heavy meals.  She has had a medium daughter work-up to include ultrasound of the right upper quadrant, CT scan of the abdomen and pelvis, HIDA scan upper and lower scopes.  No clear-cut etiology.  She even had an intercostal nerve block that did not help with her symptoms.  I am out of options.  She has been treated already for IBS.  Discussed with patient in detail that certainly her clinical findings may suggest biliary colic but we have not had objective evidence of this.  Options with observation versus cholecystectomy were discussed with her in detail.  I was very candid with her regarding the role of cholecystectomy.  She fully understands that the cholecystectomy may not resolve the problem and that we are running out of options.  She is fully aware of this but she is desperate. ?I discussed the procedure in detail.  The patient was given Neurosurgeon.  We discussed the risks and benefits of a laparoscopic cholecystectomy and possible cholangiogram including, but not limited to bleeding, infection, injury to surrounding structures such as the intestine or liver, bile leak, retained gallstones, need to convert to an open procedure, prolonged diarrhea, blood clots such as  DVT, common bile duct injury, anesthesia risks, and possible need for additional procedures.  The likelihood of improvement in symptoms and return to the patient's normal status is good. We discussed the  typical post-operative recovery course.  ?Husband at bedside. ?Please note that I have spent greater than 40 minutes in this encounter including personally reviewing imaging studies, coordinating her care, counseling the patient and family and performing appropriate documentation ? ?Caroleen Hamman, MD FACS ?General Surgeon  ?

## 2021-08-05 NOTE — Patient Instructions (Signed)
Please call our office if you have any questions or concerns.  ? ?Minimally Invasive Cholecystectomy ?Minimally invasive cholecystectomy is surgery to remove the gallbladder. The gallbladder is a pear-shaped organ that lies beneath the liver on the right side of the body. The gallbladder stores bile, which is a fluid that helps the body digest fats. Cholecystectomy is often done to treat inflammation (irritation and swelling) of the gallbladder (cholecystitis). This condition is usually caused by a buildup of gallstones (cholelithiasis) in the gallbladder or when the fluid in the gall bladder becomes stagnant because gallstones get stuck in the ducts (tubes) and block the flow of bile. This can result in inflammation and pain. In severe cases, emergency surgery may be required. ?This procedure is done through small incisions in the abdomen, instead of one large incision. It is also called laparoscopic surgery. A thin scope with a camera (laparoscope) is inserted through one incision. Then surgical instruments are inserted through the other incisions. In some cases, a minimally invasive surgery may need to be changed to a surgery that is done through a larger incision. This is called open surgery. ?Tell a health care provider about: ?Any allergies you have. ?All medicines you are taking, including vitamins, herbs, eye drops, creams, and over-the-counter medicines. ?Any problems you or family members have had with anesthetic medicines. ?Any bleeding problems you have. ?Any surgeries you have had. ?Any medical conditions you have. ?Whether you are pregnant or may be pregnant. ?What are the risks? ?Generally, this is a safe procedure. However, problems may occur, including: ?Infection. ?Bleeding. ?Allergic reactions to medicines. ?Damage to nearby structures or organs. ?A gallstone remaining in the common bile duct. The common bile duct carries bile from the gallbladder to the small intestine. ?A bile leak from the  liver or cystic duct after your gallbladder is removed. ?What happens before the procedure? ?When to stop eating and drinking ?Follow instructions from your health care provider about what you may eat and drink before your procedure. These may include: ?8 hours before the procedure ?Stop eating most foods. Do not eat meat, fried foods, or fatty foods. ?Eat only light foods, such as toast or crackers. ?All liquids are okay except energy drinks and alcohol. ?6 hours before the procedure ?Stop eating. ?Drink only clear liquids, such as water, clear fruit juice, black coffee, plain tea, and sports drinks. ?Do not drink energy drinks or alcohol. ?2 hours before the procedure ?Stop drinking all liquids. ?You may be allowed to take medicines with small sips of water. ?If you do not follow your health care provider's instructions, your procedure may be delayed or canceled. ?Medicines ?Ask your health care provider about: ?Changing or stopping your regular medicines. This is especially important if you are taking diabetes medicines or blood thinners. ?Taking medicines such as aspirin and ibuprofen. These medicines can thin your blood. Do not take these medicines unless your health care provider tells you to take them. ?Taking over-the-counter medicines, vitamins, herbs, and supplements. ?General instructions ?If you will be going home right after the procedure, plan to have a responsible adult: ?Take you home from the hospital or clinic. You will not be allowed to drive. ?Care for you for the time you are told. ?Do not use any products that contain nicotine or tobacco for at least 4 weeks before the procedure. These products include cigarettes, chewing tobacco, and vaping devices, such as e-cigarettes. If you need help quitting, ask your health care provider. ?Ask your health care provider: ?How your  surgery site will be marked. ?What steps will be taken to help prevent infection. These may include: ?Removing hair at the  surgery site. ?Washing skin with a germ-killing soap. ?Taking antibiotic medicine. ?What happens during the procedure? ? ?An IV will be inserted into one of your veins. ?You will be given one or both of the following: ?A medicine to help you relax (sedative). ?A medicine to make you fall asleep (general anesthetic). ?Your surgeon will make several small incisions in your abdomen. ?The laparoscope will be inserted through one of the small incisions. The camera on the laparoscope will send images to a monitor in the operating room. This lets your surgeon see inside your abdomen. ?A gas will be pumped into your abdomen. This will expand your abdomen to give the surgeon more room to perform the surgery. ?Other tools that are needed for the procedure will be inserted through the other incisions. The gallbladder will be removed through one of the incisions. ?Your common bile duct may be examined. If stones are found in the common bile duct, they may be removed. ?After your gallbladder has been removed, the incisions will be closed with stitches (sutures), staples, or skin glue. ?Your incisions will be covered with a bandage (dressing). ?The procedure may vary among health care providers and hospitals. ?What happens after the procedure? ?Your blood pressure, heart rate, breathing rate, and blood oxygen level will be monitored until you leave the hospital or clinic. ?You will be given medicines as needed to control your pain. ?You may have a drain placed in the incision. The drain will be removed a day or two after the procedure. ?Summary ?Minimally invasive cholecystectomy, also called laparoscopic cholecystectomy, is surgery to remove the gallbladder using small incisions. ?Tell your health care provider about all the medical conditions you have and all the medicines you are taking for those conditions. ?Before the procedure, follow instructions about when to stop eating and drinking and changing or stopping  medicines. ?Plan to have a responsible adult care for you for the time you are told after you leave the hospital or clinic. ?This information is not intended to replace advice given to you by your health care provider. Make sure you discuss any questions you have with your health care provider. ?Document Revised: 11/25/2020 Document Reviewed: 11/25/2020 ?Elsevier Patient Education ? [employer]. ? ?

## 2021-08-05 NOTE — H&P (View-Only) (Signed)
Outpatient Surgical Follow Up ? ?08/05/2021 ? ?Kristie Hall is an 61 y.o. female.  ? ?Chief Complaint  ?Patient presents with  ? Follow-up  ?  Abd pain  ? ? ?HPI: Kristie Hall is a 61 y.o. female seen f/u RUQ pain/She reports that this specifically get worse after eating about half an hour especially with fatty meals.  Pain is colicky intermittent and lasts for 15 to 20 minutes.  Now she has crescendo symptoms and worsening of her pain for the last few weeks.  She has been seen by Dr. Vicente Males from GI and ultrasound as well as HIDA scan has been performed.  I have personally reviewed the images showing no evidence of gallbladder disease or stones.  Normal ejection fraction GB. ?She also has had upper and lower scopes by Dr. Vicente Males that there were unrevealing.  Please also note that I have personally reviewed the images. ?She is desperate given her persistent pain.   ?Last week I performed an intercostal nerve block in the office with no improvement on pain.  That night she had a heavy meal and experiencing problems.  She also had a CT scan that I ordered and I have personally reviewed showing no evidence of any clear-cut intra-abdominal pathology. ? ? ?She is able to perform more than 4 METS of activity without shortness of breath or chest pain.  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 ?Her daughter had somewhat similar symptoms and she did have cholecystectomy with good results. ?She has had a recent CBC and CMP that were completely normal. ?   ?  ? ?Past Medical History:  ?Diagnosis Date  ? Endometriosis   ? Melanoma ([place])   ? melanoma leg  ? Migraines   ? Sinusitis   ? Vitamin D deficiency   ? ? ?Past Surgical History:  ?Procedure Laterality Date  ? ABDOMINAL HYSTERECTOMY  2008  ? COLONOSCOPY WITH PROPOFOL N/A 07/14/2021  ? Procedure: COLONOSCOPY WITH PROPOFOL;  Surgeon: Jonathon Bellows, MD;  Location: [facility] ENDOSCOPY;  Service: Gastroenterology;  Laterality: N/A;  ? DILATION AND CURETTAGE OF UTERUS    ?  endrometrial surgery    ? FINGER SURGERY    ? 11/2015 neg  ? JOINT REPLACEMENT    ? LAPAROSCOPY  2007  ? SHOULDER ARTHROSCOPY W/ ROTATOR CUFF REPAIR Right   ? TUBAL LIGATION  1991  ? ? ?Family History  ?Problem Relation Age of Onset  ? Diabetes Father   ? Heart disease Father   ? Breast cancer Sister 4  ? Ovarian cancer Neg Hx   ? Colon cancer Neg Hx   ? ? ?Social History:  reports that she has never smoked. She has never used smokeless tobacco. She reports current alcohol use. She reports that she does not use drugs. ? ?Allergies:  ?Allergies  ?Allergen Reactions  ? Imitrex [Sumatriptan] Shortness Of Breath  ? ? ?Medications reviewed. ? ? ? ?ROS ?Full ROS performed and is otherwise negative other than what is stated in HPI ? ? ?BP 133/85   Pulse 76   Temp 98.2 ?F (36.8 ?C) (Oral)   Ht 5\' 7"  (1.702 m)   Wt 168 lb 12.8 oz (76.6 kg)   SpO2 96%   BMI 26.44 kg/m?  ? ?Physical Exam ?Vitals and nursing note reviewed. Exam conducted with a chaperone present.  ?Constitutional:   ?   General: She is not in acute distress. ?   Appearance: Normal appearance. She is normal weight.  ?Eyes:  ?  General: No scleral icterus.    ?   Right eye: No discharge.     ?   Left eye: No discharge.  ?Cardiovascular:  ?   Rate and Rhythm: Normal rate and regular rhythm.  ?   Heart sounds: No murmur heard. ?Pulmonary:  ?   Effort: Pulmonary effort is normal. No respiratory distress.  ?   Breath sounds: Normal breath sounds. No stridor. No wheezing or rhonchi.  ?Abdominal:  ?   General: Abdomen is flat. There is no distension.  ?   Palpations: Abdomen is soft. There is no mass.  ?   Tenderness: There is no abdominal tenderness. There is no guarding or rebound.  ?   Hernia: No hernia is present.  ?Musculoskeletal:     ?   General: No swelling or tenderness. Normal range of motion.  ?   Cervical back: Normal range of motion. No rigidity or tenderness.  ?Lymphadenopathy:  ?   Cervical: No cervical adenopathy.  ?Skin: ?   General: Skin is  warm and dry.  ?   Capillary Refill: Capillary refill takes less than 2 seconds.  ?   Coloration: Skin is not jaundiced or pale.  ?Neurological:  ?   General: No focal deficit present.  ?   Mental Status: She is alert and oriented to person, place, and time.  ?Psychiatric:     ?   Mood and Affect: Mood normal.     ?   Behavior: Behavior normal.     ?   Thought Content: Thought content normal.     ?   Judgment: Judgment normal.  ? ? ? ? ?Assessment/Plan: ?61 year old female with persistent right upper quadrant pain worsening after heavy meals.  She has had a medium daughter work-up to include ultrasound of the right upper quadrant, CT scan of the abdomen and pelvis, HIDA scan upper and lower scopes.  No clear-cut etiology.  She even had an intercostal nerve block that did not help with her symptoms.  I am out of options.  She has been treated already for IBS.  Discussed with patient in detail that certainly her clinical findings may suggest biliary colic but we have not had objective evidence of this.  Options with observation versus cholecystectomy were discussed with her in detail.  I was very candid with her regarding the role of cholecystectomy.  She fully understands that the cholecystectomy may not resolve the problem and that we are running out of options.  She is fully aware of this but she is desperate. ?I discussed the procedure in detail.  The patient was given Neurosurgeon.  We discussed the risks and benefits of a laparoscopic cholecystectomy and possible cholangiogram including, but not limited to bleeding, infection, injury to surrounding structures such as the intestine or liver, bile leak, retained gallstones, need to convert to an open procedure, prolonged diarrhea, blood clots such as  DVT, common bile duct injury, anesthesia risks, and possible need for additional procedures.  The likelihood of improvement in symptoms and return to the patient's normal status is good. We discussed the  typical post-operative recovery course.  ?Husband at bedside. ?Please note that I have spent greater than 40 minutes in this encounter including personally reviewing imaging studies, coordinating her care, counseling the patient and family and performing appropriate documentation ? ?Caroleen Hamman, MD FACS ?General Surgeon  ?

## 2021-08-06 ENCOUNTER — Other Ambulatory Visit
Admission: RE | Admit: 2021-08-06 | Discharge: 2021-08-06 | Disposition: A | Discharge status: Home / Self Care | Payer: BC Managed Care – PPO | Source: Ambulatory Visit | Attending: Surgery | Admitting: Surgery

## 2021-08-06 ENCOUNTER — Other Ambulatory Visit: Payer: Self-pay

## 2021-08-06 NOTE — Patient Instructions (Signed)
?Your procedure is scheduled on: Thursday August 13, 2021. ?Report to Day Surgery inside [facility] 2nd floor, stop by admissions desk before getting on elevator.  ?To find out your arrival time please call [phone] between 1PM - 3PM on Wednesday August 12, 2021. ? ?Remember: Instructions that are not followed completely may result in serious medical risk,  ?up to and including death, or upon the discretion of your surgeon and anesthesiologist your  ?surgery may need to be rescheduled.  ? ?  _X__ 1. Do not eat food after midnight the night before your procedure. ?                No chewing gum or hard candies. You may drink clear liquids up to 2 hours ?                before you are scheduled to arrive for your surgery- DO not drink clear ?                liquids within 2 hours of the start of your surgery. ?                Clear Liquids include:  water, apple juice without pulp, clear Gatorade, G2 or  ?                Gatorade Zero (avoid Red/Purple/Blue), Black Coffee or Tea (Do not add ?                anything to coffee or tea). ? ?__X__2.  On the morning of surgery brush your teeth with toothpaste and water, you ?               may rinse your mouth with mouthwash if you wish.  Do not swallow any toothpaste or mouthwash. ?   ? _X__ 3.  No Alcohol for 24 hours before or after surgery. ? ? _X__ 4.  Do Not Smoke or use e-cigarettes For 24 Hours Prior to Your Surgery. ?                Do not use any chewable tobacco products for at least 6 hours prior to ?                Surgery. ? ?_X__  5.  Do not use any recreational drugs (marijuana, cocaine, heroin, ecstasy, MDMA or other) ?               For at least one week prior to your surgery.  Combination of these drugs with anesthesia ?               May have life threatening results. ? ?____  6.  Bring all medications with you on the day of surgery if instructed.  ? ?__X_ 7.  Notify your doctor if there is any change in your medical condition   ?    (cold, fever, infections). ?    ?Do not wear jewelry, make-up, hairpins, clips or nail polish. ?Do not wear lotions, powders, or perfumes. You may wear deodorant. ?Do not shave 48 hours prior to surgery.  ?Do not bring valuables to the hospital.   ? ?[facility] is not responsible for any belongings or valuables. ? ?Contacts, dentures or bridgework may not be worn into surgery. ?Leave your suitcase in the car. After surgery it may be brought to your room. ?For patients admitted to the hospital, discharge time is determined by your ?treatment team. ?  ?  Patients discharged the day of surgery will not be allowed to drive home.   ?Make arrangements for someone to be with you for the first 24 hours of your ?Same Day Discharge. ? ? ?__X__ Take these medicines the morning of surgery with A SIP OF WATER:  ? ? 1. None  ? 2.  ? 3.  ? 4. ? 5. ? 6. ? ?____ Fleet Enema (as directed)  ? ?__X__ Use Antibacterial Soap (or wipes) as directed ? ?____ Use Benzoyl Peroxide Gel as instructed ? ?____ Use inhalers on the day of surgery ? ?____ Stop metformin 2 days prior to surgery   ? ?____ Take 1/2 of usual insulin dose the night before surgery. No insulin the morning ?         of surgery.  ? ?____ Call your PCP, cardiologist, or Pulmonologist if taking Coumadin/Plavix/aspirin and ask when to stop before your surgery.  ? ?__X__ One Week prior to surgery- Stop Anti-inflammatories such as Ibuprofen, Aleve, Advil, Motrin, meloxicam (MOBIC), diclofenac, etodolac, ketorolac, Toradol, Daypro, piroxicam, Goody's or BC powders. OK TO USE TYLENOL IF NEEDED ?  ?__X__ Stop supplements until after surgery.   ? ?____ Bring C-Pap to the hospital.  ? ? ?If you have any questions regarding your pre-procedure instructions,  ?Please call Pre-admit Testing at [phone] ?

## 2021-08-10 ENCOUNTER — Ambulatory Visit: Payer: BC Managed Care – PPO | Admitting: Gastroenterology

## 2021-08-13 ENCOUNTER — Encounter
Admission: RE | Disposition: A | Discharge status: Home / Self Care | Payer: Self-pay | Source: Home / Self Care | Attending: Surgery

## 2021-08-13 ENCOUNTER — Ambulatory Visit: Payer: BC Managed Care – PPO | Admitting: Certified Registered Nurse Anesthetist

## 2021-08-13 ENCOUNTER — Encounter: Payer: Self-pay | Admitting: Surgery

## 2021-08-13 ENCOUNTER — Ambulatory Visit
Admission: RE | Admit: 2021-08-13 | Discharge: 2021-08-13 | Disposition: A | Discharge status: Home / Self Care | Payer: BC Managed Care – PPO | Attending: Surgery | Admitting: Surgery

## 2021-08-13 ENCOUNTER — Other Ambulatory Visit: Payer: Self-pay

## 2021-08-13 DIAGNOSIS — Z85820 Personal history of malignant melanoma of skin: Secondary | ICD-10-CM | POA: Diagnosis not present

## 2021-08-13 DIAGNOSIS — K811 Chronic cholecystitis: Secondary | ICD-10-CM | POA: Diagnosis not present

## 2021-08-13 DIAGNOSIS — K8044 Calculus of bile duct with chronic cholecystitis without obstruction: Secondary | ICD-10-CM

## 2021-08-13 DIAGNOSIS — K8050 Calculus of bile duct without cholangitis or cholecystitis without obstruction: Secondary | ICD-10-CM

## 2021-08-13 SURGERY — CHOLECYSTECTOMY, ROBOT-ASSISTED, LAPAROSCOPIC
Anesthesia: General | Site: Abdomen

## 2021-08-13 MED ORDER — ONDANSETRON HCL 4 MG/2ML IJ SOLN
INTRAMUSCULAR | Status: AC
  Filled 2021-08-13: qty 2

## 2021-08-13 MED ORDER — CEFAZOLIN SODIUM-DEXTROSE 2-4 GM/100ML-% IV SOLN
2.0000 g | INTRAVENOUS | Status: AC
  Administered 2021-08-13: 13:00:00 2 g via INTRAVENOUS

## 2021-08-13 MED ORDER — CHLORHEXIDINE GLUCONATE 0.12 % MT SOLN
OROMUCOSAL | Status: AC
  Filled 2021-08-13: qty 15

## 2021-08-13 MED ORDER — DEXMEDETOMIDINE HCL IN NACL 200 MCG/50ML IV SOLN
INTRAVENOUS | Status: DC | PRN
  Administered 2021-08-13: 12 ug via INTRAVENOUS
  Administered 2021-08-13: 8 ug via INTRAVENOUS

## 2021-08-13 MED ORDER — MIDAZOLAM HCL 2 MG/2ML IJ SOLN
INTRAMUSCULAR | Status: DC | PRN
  Administered 2021-08-13: 2 mg via INTRAVENOUS

## 2021-08-13 MED ORDER — CHLORHEXIDINE GLUCONATE CLOTH 2 % EX PADS: 6.0000 | MEDICATED_PAD | Freq: Once | CUTANEOUS | Status: DC

## 2021-08-13 MED ORDER — FENTANYL CITRATE (PF) 100 MCG/2ML IJ SOLN
INTRAMUSCULAR | Status: AC
  Filled 2021-08-13: qty 2

## 2021-08-13 MED ORDER — PROMETHAZINE HCL 25 MG/ML IJ SOLN
INTRAMUSCULAR | Status: AC
  Filled 2021-08-13: qty 1

## 2021-08-13 MED ORDER — FENTANYL CITRATE (PF) 100 MCG/2ML IJ SOLN
25.0000 ug | INTRAMUSCULAR | Status: DC | PRN
  Administered 2021-08-13 (×2): 25 ug via INTRAVENOUS

## 2021-08-13 MED ORDER — CHLORHEXIDINE GLUCONATE 0.12 % MT SOLN
OROMUCOSAL | Status: AC
  Administered 2021-08-13: 10:00:00 15 mL via OROMUCOSAL
  Filled 2021-08-13: qty 15

## 2021-08-13 MED ORDER — HYDROCODONE-ACETAMINOPHEN 5-325 MG PO TABS: 1.0000 | ORAL_TABLET | ORAL | 0 refills | Status: DC | PRN

## 2021-08-13 MED ORDER — BUPIVACAINE-EPINEPHRINE 0.25% -1:200000 IJ SOLN
INTRAMUSCULAR | Status: DC | PRN
  Administered 2021-08-13: 50 mL

## 2021-08-13 MED ORDER — ROCURONIUM BROMIDE 10 MG/ML (PF) SYRINGE
PREFILLED_SYRINGE | INTRAVENOUS | Status: AC
  Filled 2021-08-13: qty 10

## 2021-08-13 MED ORDER — BUPIVACAINE-EPINEPHRINE (PF) 0.25% -1:200000 IJ SOLN
INTRAMUSCULAR | Status: AC
  Filled 2021-08-13: qty 30

## 2021-08-13 MED ORDER — MIDAZOLAM HCL 2 MG/2ML IJ SOLN
INTRAMUSCULAR | Status: AC
Start: 2021-08-13 — End: ?
  Filled 2021-08-13: qty 2

## 2021-08-13 MED ORDER — ROCURONIUM BROMIDE 100 MG/10ML IV SOLN
INTRAVENOUS | Status: DC | PRN
  Administered 2021-08-13: 20 mg via INTRAVENOUS
  Administered 2021-08-13: 60 mg via INTRAVENOUS

## 2021-08-13 MED ORDER — BUPIVACAINE LIPOSOME 1.3 % IJ SUSP
INTRAMUSCULAR | Status: AC
  Filled 2021-08-13: qty 10

## 2021-08-13 MED ORDER — CEFAZOLIN SODIUM-DEXTROSE 2-4 GM/100ML-% IV SOLN
INTRAVENOUS | Status: AC
  Filled 2021-08-13: qty 100

## 2021-08-13 MED ORDER — SODIUM CHLORIDE 0.9 % IV SOLN
6.2500 mg | Freq: Four times a day (QID) | INTRAVENOUS | Status: DC | PRN
  Filled 2021-08-13: qty 0.25

## 2021-08-13 MED ORDER — DEXAMETHASONE SODIUM PHOSPHATE 10 MG/ML IJ SOLN
INTRAMUSCULAR | Status: AC
  Filled 2021-08-13: qty 1

## 2021-08-13 MED ORDER — BUPIVACAINE LIPOSOME 1.3 % IJ SUSP
INTRAMUSCULAR | Status: AC
  Filled 2021-08-13: qty 20

## 2021-08-13 MED ORDER — ORAL CARE MOUTH RINSE: 15.0000 mL | Freq: Once | OROMUCOSAL | Status: AC

## 2021-08-13 MED ORDER — SUGAMMADEX SODIUM 200 MG/2ML IV SOLN
INTRAVENOUS | Status: DC | PRN
  Administered 2021-08-13: 200 mg via INTRAVENOUS

## 2021-08-13 MED ORDER — ONDANSETRON HCL 4 MG/2ML IJ SOLN
INTRAMUSCULAR | Status: DC | PRN
Start: 2021-08-13 — End: 2021-08-13
  Administered 2021-08-13: 4 mg via INTRAVENOUS

## 2021-08-13 MED ORDER — PROPOFOL 10 MG/ML IV BOLUS
INTRAVENOUS | Status: DC | PRN
  Administered 2021-08-13: 140 mg via INTRAVENOUS

## 2021-08-13 MED ORDER — 0.9 % SODIUM CHLORIDE (POUR BTL) OPTIME
TOPICAL | Status: DC | PRN
  Administered 2021-08-13: 13:00:00 500 mL

## 2021-08-13 MED ORDER — CELECOXIB 200 MG PO CAPS: 200.0000 mg | ORAL_CAPSULE | ORAL | Status: AC

## 2021-08-13 MED ORDER — PROPOFOL 10 MG/ML IV BOLUS
INTRAVENOUS | Status: AC
  Filled 2021-08-13: qty 20

## 2021-08-13 MED ORDER — OXYCODONE HCL 5 MG/5ML PO SOLN: 5.0000 mg | Freq: Once | ORAL | Status: DC | PRN

## 2021-08-13 MED ORDER — FAMOTIDINE 20 MG PO TABS: 20.0000 mg | ORAL_TABLET | Freq: Once | ORAL | Status: AC

## 2021-08-13 MED ORDER — LIDOCAINE HCL (PF) 2 % IJ SOLN
INTRAMUSCULAR | Status: AC
  Filled 2021-08-13: qty 5

## 2021-08-13 MED ORDER — FENTANYL CITRATE (PF) 100 MCG/2ML IJ SOLN
INTRAMUSCULAR | Status: DC | PRN
Start: 2021-08-13 — End: 2021-08-13
  Administered 2021-08-13: 50 ug via INTRAVENOUS

## 2021-08-13 MED ORDER — FENTANYL CITRATE (PF) 100 MCG/2ML IJ SOLN
INTRAMUSCULAR | Status: AC
  Administered 2021-08-13: 14:00:00 25 ug via INTRAVENOUS
  Filled 2021-08-13: qty 2

## 2021-08-13 MED ORDER — DEXAMETHASONE SODIUM PHOSPHATE 10 MG/ML IJ SOLN
INTRAMUSCULAR | Status: DC | PRN
Start: 2021-08-13 — End: 2021-08-13
  Administered 2021-08-13: 5 mg via INTRAVENOUS

## 2021-08-13 MED ORDER — GABAPENTIN 300 MG PO CAPS: 300.0000 mg | ORAL_CAPSULE | ORAL | Status: AC

## 2021-08-13 MED ORDER — LACTATED RINGERS IV SOLN: INTRAVENOUS | Status: DC

## 2021-08-13 MED ORDER — CHLORHEXIDINE GLUCONATE 0.12 % MT SOLN: 15.0000 mL | Freq: Once | OROMUCOSAL | Status: AC

## 2021-08-13 MED ORDER — PHENYLEPHRINE 40 MCG/ML (10ML) SYRINGE FOR IV PUSH (FOR BLOOD PRESSURE SUPPORT)
PREFILLED_SYRINGE | INTRAVENOUS | Status: DC | PRN
  Administered 2021-08-13 (×3): 160 ug via INTRAVENOUS

## 2021-08-13 MED ORDER — ACETAMINOPHEN 500 MG PO TABS
ORAL_TABLET | ORAL | Status: AC
  Administered 2021-08-13: 10:00:00 1000 mg via ORAL
  Filled 2021-08-13: qty 2

## 2021-08-13 MED ORDER — LIDOCAINE HCL (CARDIAC) PF 100 MG/5ML IV SOSY
PREFILLED_SYRINGE | INTRAVENOUS | Status: DC | PRN
  Administered 2021-08-13: 100 mg via INTRAVENOUS

## 2021-08-13 MED ORDER — CELECOXIB 200 MG PO CAPS
ORAL_CAPSULE | ORAL | Status: AC
  Administered 2021-08-13: 10:00:00 200 mg via ORAL
  Filled 2021-08-13: qty 1

## 2021-08-13 MED ORDER — OXYCODONE HCL 5 MG PO TABS: 5.0000 mg | ORAL_TABLET | Freq: Once | ORAL | Status: DC | PRN

## 2021-08-13 MED ORDER — INDOCYANINE GREEN 25 MG IV SOLR
5.0000 mg | Freq: Once | INTRAVENOUS | Status: AC
  Administered 2021-08-13: 10:00:00 5 mg via INTRAVENOUS
  Filled 2021-08-13: qty 2

## 2021-08-13 MED ORDER — FAMOTIDINE 20 MG PO TABS
ORAL_TABLET | ORAL | Status: AC
  Administered 2021-08-13: 10:00:00 20 mg via ORAL
  Filled 2021-08-13: qty 1

## 2021-08-13 MED ORDER — GABAPENTIN 300 MG PO CAPS
ORAL_CAPSULE | ORAL | Status: AC
  Administered 2021-08-13: 10:00:00 300 mg via ORAL
  Filled 2021-08-13: qty 1

## 2021-08-13 MED ORDER — ONDANSETRON HCL 4 MG/2ML IJ SOLN
4.0000 mg | Freq: Once | INTRAMUSCULAR | Status: AC
  Administered 2021-08-13: 15:00:00 4 mg via INTRAVENOUS

## 2021-08-13 MED ORDER — KETAMINE HCL 10 MG/ML IJ SOLN
INTRAMUSCULAR | Status: DC | PRN
Start: 2021-08-13 — End: 2021-08-13
  Administered 2021-08-13: 30 mg via INTRAVENOUS
  Administered 2021-08-13: 20 mg via INTRAVENOUS

## 2021-08-13 MED ORDER — ACETAMINOPHEN 500 MG PO TABS: 1000.0000 mg | ORAL_TABLET | ORAL | Status: AC

## 2021-08-13 MED ORDER — KETAMINE HCL 50 MG/5ML IJ SOSY
PREFILLED_SYRINGE | INTRAMUSCULAR | Status: AC
  Filled 2021-08-13: qty 5

## 2021-08-13 SURGICAL SUPPLY — 54 items
ADH SKN CLS APL DERMABOND .7 (GAUZE/BANDAGES/DRESSINGS) ×1
BAG RETRIEVAL 10 (BASKET) ×1
CANNULA REDUC XI 12-8 STAPL (CANNULA) ×1
CANNULA REDUCER 12-8 DVNC XI (CANNULA) ×1 IMPLANT
CATH REDDICK CHOLANGI 4FR 50CM (CATHETERS) IMPLANT
CLIP LIGATING HEMO O LOK GREEN (MISCELLANEOUS) ×2 IMPLANT
DERMABOND ADVANCED (GAUZE/BANDAGES/DRESSINGS) ×1
DERMABOND ADVANCED .7 DNX12 (GAUZE/BANDAGES/DRESSINGS) ×1 IMPLANT
DRAPE ARM DVNC X/XI (DISPOSABLE) ×4 IMPLANT
DRAPE COLUMN DVNC XI (DISPOSABLE) ×1 IMPLANT
DRAPE DA VINCI XI ARM (DISPOSABLE) ×4
DRAPE DA VINCI XI COLUMN (DISPOSABLE) ×1
ELECT CAUTERY BLADE 6.4 (BLADE) ×2 IMPLANT
ELECT REM PT RETURN 9FT ADLT (ELECTROSURGICAL) ×2
ELECTRODE REM PT RTRN 9FT ADLT (ELECTROSURGICAL) ×1 IMPLANT
GLOVE SURG ENC MOIS LTX SZ7 (GLOVE) ×4 IMPLANT
GOWN STRL REUS W/ TWL LRG LVL3 (GOWN DISPOSABLE) ×4 IMPLANT
GOWN STRL REUS W/TWL LRG LVL3 (GOWN DISPOSABLE) ×8
IRRIGATION STRYKERFLOW (MISCELLANEOUS) IMPLANT
IRRIGATOR STRYKERFLOW (MISCELLANEOUS)
IV CATH ANGIO 12GX3 LT BLUE (NEEDLE) IMPLANT
IV NS 1000ML (IV SOLUTION)
IV NS 1000ML BAXH (IV SOLUTION) IMPLANT
KIT PINK PAD W/HEAD ARE REST (MISCELLANEOUS) ×2
KIT PINK PAD W/HEAD ARM REST (MISCELLANEOUS) ×1 IMPLANT
LABEL OR SOLS (LABEL) ×2 IMPLANT
MANIFOLD NEPTUNE II (INSTRUMENTS) ×2 IMPLANT
NEEDLE HYPO 22GX1.5 SAFETY (NEEDLE) ×2 IMPLANT
NS IRRIG 500ML POUR BTL (IV SOLUTION) ×2 IMPLANT
OBTURATOR OPTICAL STANDARD 8MM (TROCAR) ×1
OBTURATOR OPTICAL STND 8 DVNC (TROCAR) ×1
OBTURATOR OPTICALSTD 8 DVNC (TROCAR) ×1 IMPLANT
PACK LAP CHOLECYSTECTOMY (MISCELLANEOUS) ×2 IMPLANT
PENCIL ELECTRO HAND CTR (MISCELLANEOUS) ×2 IMPLANT
SEAL CANN UNIV 5-8 DVNC XI (MISCELLANEOUS) ×3 IMPLANT
SEAL XI 5MM-8MM UNIVERSAL (MISCELLANEOUS) ×3
SET TUBE SMOKE EVAC HIGH FLOW (TUBING) ×2 IMPLANT
SOLUTION ELECTROLUBE (MISCELLANEOUS) ×2 IMPLANT
SPIKE FLUID TRANSFER (MISCELLANEOUS) ×2 IMPLANT
SPONGE T-LAP 18X18 ~~LOC~~+RFID (SPONGE) ×2 IMPLANT
SPONGE T-LAP 4X18 ~~LOC~~+RFID (SPONGE) IMPLANT
STAPLER CANNULA SEAL DVNC XI (STAPLE) ×1 IMPLANT
STAPLER CANNULA SEAL XI (STAPLE) ×1
STOPCOCK 3 WAY MALE LL (IV SETS)
STOPCOCK 3WAY MALE LL (IV SETS) IMPLANT
SUT MNCRL AB 4-0 PS2 18 (SUTURE) ×2 IMPLANT
SUT VICRYL 0 AB UR-6 (SUTURE) ×4 IMPLANT
SYR 20ML LL LF (SYRINGE) ×2 IMPLANT
SYR 30ML LL (SYRINGE) ×2 IMPLANT
SYS BAG RETRIEVAL 10MM (BASKET) ×1
SYSTEM BAG RETRIEVAL 10MM (BASKET) ×1 IMPLANT
TAPE TRANSPORE STRL 2 31045 (GAUZE/BANDAGES/DRESSINGS) ×2 IMPLANT
TROCAR BALLN GELPORT 12X130M (ENDOMECHANICALS) ×2 IMPLANT
WATER STERILE IRR 500ML POUR (IV SOLUTION) ×2 IMPLANT

## 2021-08-13 NOTE — Interval H&P Note (Signed)
History and Physical Interval Note: ? ?08/13/2021 ?9:59 AM ? ?Kristie Hall  has presented today for surgery, with the diagnosis of biliary colie.  The various methods of treatment have been discussed with the patient and family. After consideration of risks, benefits and other options for treatment, the patient has consented to  Procedure(s): ?XI ROBOTIC ASSISTED LAPAROSCOPIC CHOLECYSTECTOMY (N/A) ?INDOCYANINE GREEN FLUORESCENCE IMAGING (ICG) (N/A) as a surgical intervention.  The patient's history has been reviewed, patient examined, no change in status, stable for surgery.  I have reviewed the patient's chart and labs.  Questions were answered to the patient's satisfaction.   ? ? ?Kristie Hall ? ? ?

## 2021-08-13 NOTE — Transfer of Care (Signed)
Immediate Anesthesia Transfer of Care Note ? ?Patient: Kristie Hall ? ?Procedure(s) Performed: XI ROBOTIC ASSISTED LAPAROSCOPIC CHOLECYSTECTOMY (Abdomen) ?INDOCYANINE GREEN FLUORESCENCE IMAGING (ICG) (Abdomen) ? ?Patient Location: PACU ? ?Anesthesia Type:General ? ?Level of Consciousness: drowsy ? ?Airway & Oxygen Therapy: Patient Spontanous Breathing and Patient connected to face mask oxygen ? ?Post-op Assessment: Report given to RN and Post -op Vital signs reviewed and stable ? ?Post vital signs: Reviewed and stable ? ?Last Vitals:  ?Vitals Value Taken Time  ?BP 122/66 08/13/21 1340  ?Temp    ?Pulse 88 08/13/21 1343  ?Resp 25 08/13/21 1343  ?SpO2 99 % 08/13/21 1343  ?Vitals shown include unvalidated device data. ? ?Last Pain:  ?Vitals:  ? 08/13/21 0955  ?TempSrc: Oral  ?PainSc: 0-No pain  ?   ? ?  ? ?Complications: No notable events documented. ?

## 2021-08-13 NOTE — Anesthesia Postprocedure Evaluation (Signed)
Anesthesia Post Note ? ?Patient: GERLEAN CID ? ?Procedure(s) Performed: XI ROBOTIC ASSISTED LAPAROSCOPIC CHOLECYSTECTOMY (Abdomen) ?INDOCYANINE GREEN FLUORESCENCE IMAGING (ICG) (Abdomen) ? ?Patient location during evaluation: PACU ?Anesthesia Type: General ?Level of consciousness: awake and awake and alert ?Pain management: satisfactory to patient ?Vital Signs Assessment: post-procedure vital signs reviewed and stable ?Respiratory status: respiratory function stable ?Cardiovascular status: stable ?Anesthetic complications: no ? ? ?No notable events documented. ? ? ?Last Vitals:  ?Vitals:  ? 08/13/21 1415 08/13/21 1430  ?BP: 109/69 116/64  ?Pulse: 64 68  ?Resp: 10 (!) 25  ?Temp: (!) 36.3 ?C   ?SpO2: 97% 95%  ?  ?Last Pain:  ?Vitals:  ? 08/13/21 1430  ?TempSrc:   ?PainSc: 6   ? ? ?  ?  ?  ?  ?  ?  ? ?VAN STAVEREN,Allisa Einspahr ? ? ? ? ?

## 2021-08-13 NOTE — Op Note (Signed)
Robotic assisted laparoscopic Cholecystectomy ? ?Pre-operative Diagnosis: Biliary colic ? ?Post-operative Diagnosis: same ? ?Procedure:  ?Robotic assisted laparoscopic Cholecystectomy ? ?Surgeon: Caroleen Hamman, MD FACS ? ?Anesthesia: Gen. with endotracheal tube ? ?Findings: ?Chronic mild Cholecystitis with omentum adhered to the GB ? ?Estimated Blood Loss: 5cc ?      ?Specimens: Gallbladder     ?      ?Complications: none ? ? ?Procedure Details  ?The patient was seen again in the Holding Room. The benefits, complications, treatment options, and expected outcomes were discussed with the patient. The risks of bleeding, infection, recurrence of symptoms, failure to resolve symptoms, bile duct damage, bile duct leak, retained common bile duct stone, bowel injury, any of which could require further surgery and/or ERCP, stent, or papillotomy were reviewed with the patient. The likelihood of improving the patient's symptoms with return to their baseline status is good.  The patient and/or family concurred with the proposed plan, giving informed consent.  The patient was taken to Operating Room, identified  and the procedure verified as Laparoscopic Cholecystectomy. ? A Time Out was held and the above information confirmed. ? ?Prior to the induction of general anesthesia, antibiotic prophylaxis was administered. VTE prophylaxis was in place. General endotracheal anesthesia was then administered and tolerated well. After the induction, the abdomen was prepped with Chloraprep and draped in the sterile fashion. The patient was positioned in the supine position. ? ?Cut down technique was used to enter the abdominal cavity and a Hasson trochar was placed after two vicryl stitches were anchored to the fascia. Pneumoperitoneum was then created with CO2 and tolerated well without any adverse changes in the patient's vital signs.  Three 8-mm ports were placed under direct vision. All skin incisions  were infiltrated with a local  anesthetic agent before making the incision and placing the trocars.  ? ?The patient was positioned  in reverse Trendelenburg, robot was brought to the surgical field and docked in the standard fashion.  We made sure all the instrumentation was kept indirect view at all times and that there were no collision between the arms. ?I scrubbed out and went to the console. ? ?The gallbladder was identified, the fundus grasped and retracted cephalad. Adhesions were lysed bluntly. The infundibulum was grasped and retracted laterally, exposing the peritoneum overlying the triangle of Calot. This was then divided and exposed in a blunt fashion. An extended critical view of the cystic duct and cystic artery was obtained.  The cystic duct was clearly identified and bluntly dissected.   Artery and duct were double clipped and divided. ?Using ICG cholangiography we visualize the cystic duct and CBD no evidence of bile injuries observed. ?The gallbladder was taken from the gallbladder fossa in a retrograde fashion with the electrocautery.  ?Hemostasis was achieved with the electrocautery. nspection of the right upper quadrant was performed. No bleeding, bile duct injury or leak, or bowel injury was noted. ?Robotic instruments and robotic arms were undocked in the standard fashion.  I scrubbed back in. ? ?The gallbladder was removed and placed in an Endocatch bag.  ? ?Pneumoperitoneum was released.  Liposomal marcaine injected to all incision sites.The periumbilical port site was closed with interrumpted 0 Vicryl sutures. 4-0 subcuticular Monocryl was used to close the skin. Dermabond was  applied.  The patient was then extubated and brought to the recovery room in stable condition. Sponge, lap, and needle counts were correct at closure and at the conclusion of the case.  ?        ?     ?  Caroleen Hamman, MD, FACS  ?

## 2021-08-13 NOTE — Anesthesia Preprocedure Evaluation (Signed)
Anesthesia Evaluation  ?Patient identified by MRN, date of birth, ID band ?Patient awake ? ? ? ?Reviewed: ?Allergy & Precautions, NPO status , Patient's Chart, lab work & pertinent test results ? ?History of Anesthesia Complications ?Negative for: history of anesthetic complications ? ?Airway ?Mallampati: II ? ?TM Distance: >3 FB ?Neck ROM: full ? ? ? Dental ? ?(+) Chipped ?  ?Pulmonary ?neg pulmonary ROS, neg shortness of breath,  ?  ?Pulmonary exam normal ? ? ? ? ? ? ? Cardiovascular ?Exercise Tolerance: Good ?(-) angina(-) Past MI and (-) DOE negative cardio ROS ?Normal cardiovascular exam ? ? ?  ?Neuro/Psych ? Headaches, negative psych ROS  ? GI/Hepatic ?negative GI ROS, Neg liver ROS, neg GERD  ,  ?Endo/Other  ?negative endocrine ROS ? Renal/GU ?  ? ?  ?Musculoskeletal ? ? Abdominal ?  ?Peds ? Hematology ?negative hematology ROS ?(+)   ?Anesthesia Other Findings ?Past Medical History: ?No date: Endometriosis ?No date: Melanoma ([place]) ?    Comment:  melanoma leg ?No date: Migraines ?No date: Sinusitis ?No date: Vitamin D deficiency ? ?Past Surgical History: ?2008: ABDOMINAL HYSTERECTOMY ?07/14/2021: COLONOSCOPY WITH PROPOFOL; N/A ?    Comment:  Procedure: COLONOSCOPY WITH PROPOFOL;  Surgeon: Vicente Males,  ?             Bailey Mech, MD;  Location: [facility];  Service:  ?             Gastroenterology;  Laterality: N/A; ?No date: DILATION AND CURETTAGE OF UTERUS ?No date: endrometrial surgery ?No date: FINGER SURGERY ?    Comment:  11/2015 neg ?2007: LAPAROSCOPY ?No date: SHOULDER ARTHROSCOPY W/ ROTATOR CUFF REPAIR; Right ?1991: TUBAL LIGATION ? ?BMI   ? Body Mass Index: 25.69 kg/m?  ?  ? ? Reproductive/Obstetrics ?negative OB ROS ? ?  ? ? ? ? ? ? ? ? ? ? ? ? ? ?  ?  ? ? ? ? ? ? ? ? ?Anesthesia Physical ?Anesthesia Plan ? ?ASA: 3 ? ?Anesthesia Plan: General ETT  ? ?Post-op Pain Management:   ? ?Induction: Intravenous ? ?PONV Risk Score and Plan: Ondansetron, Dexamethasone, Midazolam and  Treatment may vary due to age or medical condition ? ?Airway Management Planned: Oral ETT ? ?Additional Equipment:  ? ?Intra-op Plan:  ? ?Post-operative Plan: Extubation in OR ? ?Informed Consent: I have reviewed the patients History and Physical, chart, labs and discussed the procedure including the risks, benefits and alternatives for the proposed anesthesia with the patient or authorized representative who has indicated his/her understanding and acceptance.  ? ? ? ?Dental Advisory Given ? ?Plan Discussed with: Anesthesiologist, CRNA and Surgeon ? ?Anesthesia Plan Comments: (Patient consented for risks of anesthesia including but not limited to:  ?- adverse reactions to medications ?- damage to eyes, teeth, lips or other oral mucosa ?- nerve damage due to positioning  ?- sore throat or hoarseness ?- Damage to heart, brain, nerves, lungs, other parts of body or loss of life ? ?Patient voiced understanding.)  ? ? ? ? ? ? ?Anesthesia Quick Evaluation ? ?

## 2021-08-13 NOTE — Discharge Instructions (Addendum)
Laparoscopic Cholecystectomy, Care After   These instructions give you information on caring for yourself after your procedure. Your doctor may also give you more specific instructions. Call your doctor if you have any problems or questions after your procedure.  HOME CARE  Change your bandages (dressings) as told by your doctor.  Keep the wound dry and clean. Wash the wound gently with soap and water. Pat the wound dry with a clean towel.  Do not take baths, swim, or use hot tubs for 2 weeks, or as told by your doctor.  Only take medicine as told by your doctor.  Eat a normal diet as told by your doctor.  Do not lift anything heavier than 10 pounds (4.5 kg) until your doctor says it is okay.  Do not play contact sports for 1 week, or as told by your doctor. GET HELP IF:  Your wound is red, puffy (swollen), or painful.  You have yellowish-white fluid (pus) coming from the wound.  You have fluid draining from the wound for more than 1 day.  You have a bad smell coming from the wound.  Your wound breaks open. GET HELP RIGHT AWAY IF:  You have trouble breathing.  You have chest pain.  You have a fever >101  You have pain in the shoulders (shoulder strap areas) that is getting worse.  You feel dizzy or pass out (faint).  You have severe belly (abdominal) pain.  You feel sick to your stomach (nauseous) or throw up (vomit) for more than 1 day.  AMBULATORY SURGERY  DISCHARGE INSTRUCTIONS   The drugs that you were given will stay in your system until tomorrow so for the next 24 hours you should not:  Drive an automobile Make any legal decisions Drink any alcoholic beverage   You may resume regular meals tomorrow.  Today it is better to start with liquids and gradually work up to solid foods.  You may eat anything you prefer, but it is better to start with liquids, then soup and crackers, and gradually work up to solid foods.   Please notify your doctor immediately if you have any  unusual bleeding, trouble breathing, redness and pain at the surgery site, drainage, fever, or pain not relieved by medication.     Your post-operative visit with Dr.                                       is: Date:                        Time:    Please call to schedule your post-operative visit.  Additional Instructions:               

## 2021-08-13 NOTE — Anesthesia Procedure Notes (Signed)
Procedure Name: Intubation ?Date/Time: 08/13/2021 12:34 PM ?Performed by: Lia Foyer, CRNA ?Pre-anesthesia Checklist: Patient identified, Emergency Drugs available, Suction available and Patient being monitored ?Patient Re-evaluated:Patient Re-evaluated prior to induction ?Oxygen Delivery Method: Circle system utilized ?Preoxygenation: Pre-oxygenation with 100% oxygen ?Induction Type: IV induction ?Ventilation: Mask ventilation without difficulty ?Laryngoscope Size: McGraph and 3 ?Grade View: Grade I ?Tube type: Oral ?Tube size: 7.0 mm ?Number of attempts: 1 ?Airway Equipment and Method: Stylet, Oral airway and Video-laryngoscopy ?Placement Confirmation: ETT inserted through vocal cords under direct vision, positive ETCO2 and breath sounds checked- equal and bilateral ?Secured at: 22 cm ?Tube secured with: Tape ?Dental Injury: Teeth and Oropharynx as per pre-operative assessment  ? ? ? ? ?

## 2021-08-13 NOTE — Progress Notes (Signed)
Patient lethargic, states "I just can't wake up" ?Afebrile, vitals at baseline, abd with x4 port sites with dermabond. Up into recliner chair, states she can't open her eyes , spoke with patient's husband, he verbalizes that "she likes to sleep" ?Patient will awaken, move x4 ext.  ?

## 2021-08-14 LAB — SURGICAL PATHOLOGY

## 2021-08-24 ENCOUNTER — Ambulatory Visit: Payer: BC Managed Care – PPO | Admitting: Surgery

## 2021-08-27 ENCOUNTER — Ambulatory Visit: Payer: BC Managed Care – PPO | Admitting: Gastroenterology

## 2021-08-27 ENCOUNTER — Ambulatory Visit (INDEPENDENT_AMBULATORY_CARE_PROVIDER_SITE_OTHER): Payer: BC Managed Care – PPO | Admitting: Physician Assistant

## 2021-08-27 ENCOUNTER — Other Ambulatory Visit: Payer: Self-pay

## 2021-08-27 ENCOUNTER — Encounter: Payer: Self-pay | Admitting: Physician Assistant

## 2021-08-27 VITALS — BP 114/78 | HR 98 | Temp 98.6°F | Ht 67.0 in | Wt 167.0 lb

## 2021-08-27 DIAGNOSIS — K8050 Calculus of bile duct without cholangitis or cholecystitis without obstruction: Secondary | ICD-10-CM

## 2021-08-27 DIAGNOSIS — Z09 Encounter for follow-up examination after completed treatment for conditions other than malignant neoplasm: Secondary | ICD-10-CM

## 2021-08-27 NOTE — Progress Notes (Signed)
[facility] ?POST-OP OFFICE VISIT ? ?08/27/2021 ? ?HPI: ?Kristie Hall is a 61 y.o. female 14 days s/p robotic assisted laparoscopic cholecystectomy for biliary colic with Dr Dahlia Byes.  ? ?She is overall doing well; Notes resolution in her pre-operative symptoms ?No fever, chills, nausea, emesis, abdominal pain, or bowel changes ?No issues with incisions ?Tolerating PO ? ?Vital signs: ?BP 114/78   Pulse 98   Temp 98.6 ?F (37 ?C) (Oral)   Ht '5\' 7"'$  (1.702 m)   Wt 167 lb (75.8 kg)   SpO2 95%   BMI 26.16 kg/m?   ? ?Physical Exam: ?Constitutional: Well appearing female, NAD ?Abdomen: Soft, non-tender, non-distended, no rebound/guarding ?Skin: Laparoscopic incisions are healing well, no erythema or drainage  ? ?Assessment/Plan: ?This is a 61 y.o. female 14 days s/p robotic assisted laparoscopic cholecystectomy for biliary colic ? ? - Pain control prn ? - Reviewed wound care recommendation ? - Reviewed lifting restrictions; 4 weeks total ? - Reviewed surgical pathology; [place] no stone, no malignancy  ? - She can follow up on as needed basis; She understands to call with questions/concerns ? ?-- ?Edison Simon, PA-C ?[facility] ?08/27/2021, 1:54 PM ?[phone] ?M-F: 7am - 4pm ? ?

## 2021-08-27 NOTE — Patient Instructions (Signed)

## 2021-09-20 ENCOUNTER — Encounter: Payer: Self-pay | Admitting: Obstetrics and Gynecology

## 2021-09-29 ENCOUNTER — Encounter: Payer: BC Managed Care – PPO | Admitting: Certified Nurse Midwife

## 2021-10-01 ENCOUNTER — Ambulatory Visit: Payer: BC Managed Care – PPO | Admitting: Gastroenterology

## 2021-10-15 ENCOUNTER — Encounter: Payer: Self-pay | Admitting: Certified Nurse Midwife

## 2021-10-15 ENCOUNTER — Ambulatory Visit (INDEPENDENT_AMBULATORY_CARE_PROVIDER_SITE_OTHER): Payer: BC Managed Care – PPO | Admitting: Certified Nurse Midwife

## 2021-10-15 VITALS — BP 137/76 | HR 92 | Ht 66.0 in | Wt 176.8 lb

## 2021-10-15 DIAGNOSIS — G43909 Migraine, unspecified, not intractable, without status migrainosus: Secondary | ICD-10-CM | POA: Insufficient documentation

## 2021-10-15 DIAGNOSIS — Z713 Dietary counseling and surveillance: Secondary | ICD-10-CM | POA: Diagnosis not present

## 2021-10-15 MED ORDER — TOPIRAMATE 25 MG PO TABS: 25.0000 mg | ORAL_TABLET | Freq: Every day | ORAL | 0 refills | Status: DC

## 2021-10-15 NOTE — Progress Notes (Signed)
GYN ENCOUNTER NOTE ? ?Subjective:  ?    ? Kristie Hall is a 61 y.o. [phone] female is here for gynecologic evaluation of the following issues:  ?1. Difficulty losing weight. Interested in wt loss medication ?2.Migraine headache x 3 days , are worsening in intensity. Light sensitivity, debilitating affecting her daily activities. ?  ?Gynecologic History ?No LMP recorded. Patient has had a hysterectomy. ?Contraception: none, hysterectomy ?Last Pap: 04/01/2018. Results were: normal ?Last mammogram: 07/16/2021. Results were: normal ? ?Obstetric History ?OB History  ?Gravida Para Term Preterm AB Living  ?'3 2 2   1 2  '$ ?SAB IAB Ectopic Multiple Live Births  ?1       2  ?  ?# Outcome Date GA Lbr Len/2nd Weight Sex Delivery Anes PTL Lv  ?3 Term 1991   10 lb 1.6 oz (4.581 kg)  Vag-Spont   LIV  ?2 Term 1989   7 lb 2.4 oz (3.243 kg) F Vag-Spont   LIV  ?1 SAB 1984          ? ? ?Past Medical History:  ?Diagnosis Date  ? Endometriosis   ? Melanoma ([place])   ? melanoma leg  ? Migraines   ? Sinusitis   ? Vitamin D deficiency   ? ? ?Past Surgical History:  ?Procedure Laterality Date  ? ABDOMINAL HYSTERECTOMY  2008  ? COLONOSCOPY WITH PROPOFOL N/A 07/14/2021  ? Procedure: COLONOSCOPY WITH PROPOFOL;  Surgeon: Jonathon Bellows, MD;  Location: [facility] ENDOSCOPY;  Service: Gastroenterology;  Laterality: N/A;  ? DILATION AND CURETTAGE OF UTERUS    ? endrometrial surgery    ? FINGER SURGERY    ? 11/2015 neg  ? LAPAROSCOPY  2007  ? SHOULDER ARTHROSCOPY W/ ROTATOR CUFF REPAIR Right   ? TUBAL LIGATION  1991  ? ? ?Current Outpatient Medications on File Prior to Visit  ?Medication Sig Dispense Refill  ? ketoprofen (ORUDIS) 50 MG capsule Take 1 capsule (50 mg total) by mouth 4 (four) times daily as needed. 30 capsule 0  ? rizatriptan (MAXALT) 5 MG tablet TAKE 1 TABLET BY MOUTH AS NEEDED FOR MIGRAINE. MAY REPEAT IN 2 HOURS IF NEEDED 10 tablet 1  ? ?No current facility-administered medications on file prior to visit.  ? ? ?Allergies  ?Allergen Reactions  ?  Imitrex [Sumatriptan] Shortness Of Breath  ? ? ?Social History  ? ?Socioeconomic History  ? Marital status: Married  ?  Spouse name: Not on file  ? Number of children: Not on file  ? Years of education: Not on file  ? Highest education level: Not on file  ?Occupational History  ? Not on file  ?Tobacco Use  ? Smoking status: Never  ? Smokeless tobacco: Never  ?Vaping Use  ? Vaping Use: Never used  ?Substance and Sexual Activity  ? Alcohol use: Yes  ?  Comment: occas  ? Drug use: No  ? Sexual activity: Not Currently  ?  Birth control/protection: Surgical  ?Other Topics Concern  ? Not on file  ?Social History Narrative  ? Live with husband.   ? ?Social Determinants of Health  ? ?Financial Resource Strain: Not on file  ?Food Insecurity: Not on file  ?Transportation Needs: Not on file  ?Physical Activity: Not on file  ?Stress: Not on file  ?Social Connections: Not on file  ?Intimate Partner Violence: Not on file  ? ? ?Family History  ?Problem Relation Age of Onset  ? Diabetes Father   ? Heart disease Father   ? Breast  cancer Sister 92  ? Ovarian cancer Neg Hx   ? Colon cancer Neg Hx   ? ? ?The following portions of the patient's history were reviewed and updated as appropriate: allergies, current medications, past family history, past medical history, past social history, past surgical history and problem list. ? ?Review of Systems ?Review of Systems - Negative except as mentioned in HPI ?Review of Systems - General ROS: negative for - chills, fatigue, fever, hot flashes, malaise or night sweats ?Hematological and Lymphatic ROS: negative for - bleeding problems or swollen lymph nodes ?Gastrointestinal ROS: negative for - abdominal pain, blood in stools, change in bowel habits and nausea/vomiting ?Musculoskeletal ROS: negative for - joint pain, muscle pain or muscular weakness ?Genito-Urinary ROS: negative for - change in menstrual cycle, dysmenorrhea, dyspareunia, dysuria, genital discharge, genital ulcers, hematuria,  incontinence, irregular/heavy menses, nocturia or pelvic pain ? ?Objective:  ? ?BP 137/76   Pulse 92   Ht '5\' 6"'$  (1.676 m)   Wt 176 lb 12.8 oz (80.2 kg)   BMI 28.54 kg/m?  ?CONSTITUTIONAL: Well-developed, well-nourished female in no acute distress.  ?HENT:  Normocephalic, atraumatic.  ?NECK: Normal range of motion, supple, no masses.  Normal thyroid.  ?SKIN: Skin is warm and dry. No rash noted. Not diaphoretic. No erythema. No pallor. ?[place]: Alert and oriented to person, place, and time. PSYCHIATRIC: Normal mood and affect. Normal behavior. Normal judgment and thought content. ?CARDIOVASCULAR:Not Examined ?RESPIRATORY: Not Examined ?BREASTS: Not Examined ?ABDOMEN: Soft, non distended; Non tender.  No Organomegaly. ?PELVIC:not indicated  ?MUSCULOSKELETAL: Normal range of motion. No tenderness.  No cyanosis, clubbing, or edema. ? ? ? ? ?Assessment:  ? ?Migraine headaches ?Weight loss ? ?  ?Plan:  ? ?Pt state she has take Topamax in the past , stopped taking it to wean off  and has not needed it but recently her migraines have been more severe and is requesting to use again for relief. Orders placed. PT encouraged to follow back up with neurology if needed for more than 1 month. Discussed wt loss medication . Given her current BMI she does not meet requirement for phentermine. Discussed poor coverage with insurance for the GLP-1 medications. Suggested that she follow up with her insurance to see about coverage. She verbalizes and agrees to plan. Follow up , prn.  ?Face to face time 15 min ? ?Philip Aspen, CNM   ?

## 2021-10-22 ENCOUNTER — Telehealth: Payer: Self-pay | Admitting: Certified Nurse Midwife

## 2021-10-22 ENCOUNTER — Other Ambulatory Visit: Payer: Self-pay | Admitting: Certified Nurse Midwife

## 2021-10-22 DIAGNOSIS — J0100 Acute maxillary sinusitis, unspecified: Secondary | ICD-10-CM

## 2021-10-22 MED ORDER — AMOXICILLIN 875 MG PO TABS: 875.0000 mg | ORAL_TABLET | Freq: Two times a day (BID) | ORAL | 0 refills | Status: DC

## 2021-10-22 NOTE — Telephone Encounter (Signed)
Patient called and stated that she would like a z pack sent in to her pharmacy for a sinus infection. Asked patient if she had spoken with primary care, Pt states that physician has filled this antibiotic for her in the past. Please advise.

## 2021-10-22 NOTE — Telephone Encounter (Signed)
Spoke with patient. She states she has tried mucinexDM. Symptoms include greenish mucous, facial pressure under eyes and nasal area and cough. Declines fever and ear pressure. Per Philip Aspen, CNM Amoxicillin has been sent to pharmacy. Patient made aware and is appreciative.

## 2021-11-07 ENCOUNTER — Other Ambulatory Visit: Payer: Self-pay | Admitting: Certified Nurse Midwife

## 2021-12-05 ENCOUNTER — Other Ambulatory Visit: Payer: Self-pay | Admitting: Certified Nurse Midwife

## 2022-01-02 ENCOUNTER — Other Ambulatory Visit: Payer: Self-pay | Admitting: Certified Nurse Midwife

## 2022-06-11 ENCOUNTER — Telehealth: Payer: Self-pay | Admitting: Obstetrics and Gynecology

## 2022-06-11 DIAGNOSIS — Z1231 Encounter for screening mammogram for malignant neoplasm of breast: Secondary | ICD-10-CM

## 2022-06-11 NOTE — Telephone Encounter (Signed)
Pt is scheduled with you for her annual on 08/17/22. She is wanting to go ahead and schedule her mammogram so that she has it done before she sees you.  She had last mammogram on 07/16/2021.  Will you place the order for her mammogram?

## 2022-06-15 ENCOUNTER — Other Ambulatory Visit: Payer: Self-pay | Admitting: Obstetrics and Gynecology

## 2022-06-15 DIAGNOSIS — Z1231 Encounter for screening mammogram for malignant neoplasm of breast: Secondary | ICD-10-CM

## 2022-07-23 ENCOUNTER — Ambulatory Visit
Admission: RE | Admit: 2022-07-23 | Discharge: 2022-07-23 | Disposition: A | Discharge status: Home / Self Care | Payer: No Typology Code available for payment source | Source: Ambulatory Visit | Attending: Obstetrics and Gynecology | Admitting: Obstetrics and Gynecology

## 2022-07-23 DIAGNOSIS — Z1231 Encounter for screening mammogram for malignant neoplasm of breast: Secondary | ICD-10-CM | POA: Diagnosis present

## 2022-08-17 ENCOUNTER — Ambulatory Visit: Payer: Self-pay | Admitting: Obstetrics and Gynecology

## 2022-08-17 IMAGING — CT CT ABD-PELV W/ CM
2 of 5 series · 15 of 46 positions shown, 17 images · IV contrast (agent unspecified)
Comparison: HIDA scan 07/13/2021, ultrasound 07/13/2021

CLINICAL DATA: Abdominal pain

EXAM:
CT ABDOMEN AND PELVIS WITH CONTRAST
TECHNIQUE: Multidetector CT imaging of the abdomen and pelvis was performed
using the standard protocol following bolus administration of
intravenous contrast.

[Series 2: abd pelvis 5.00 · axial · 0.79mm/px · z∈[-1495,-1070]mm · 12 of 101 slices shown, 14 images]
[im 8/101  soft-tissue]
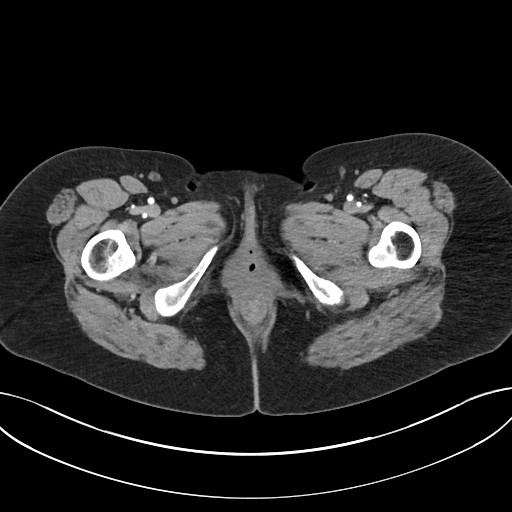
[im 8/101  bone]
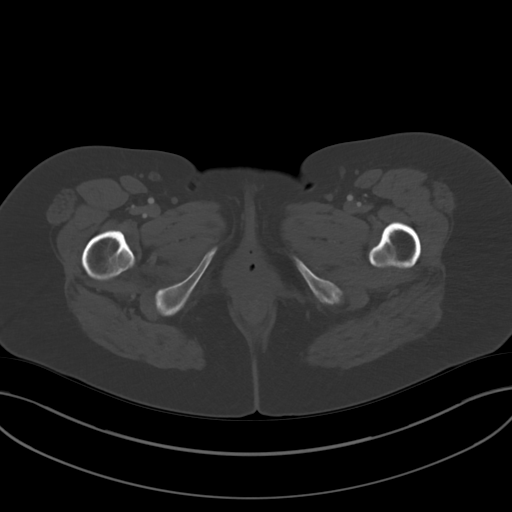
[im 16/101  soft-tissue]
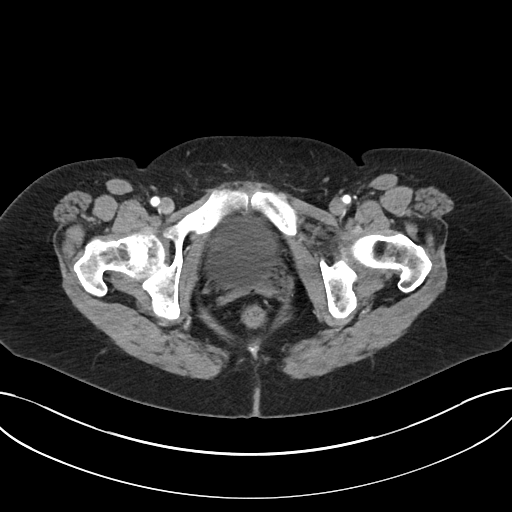
[im 24/101  soft-tissue]
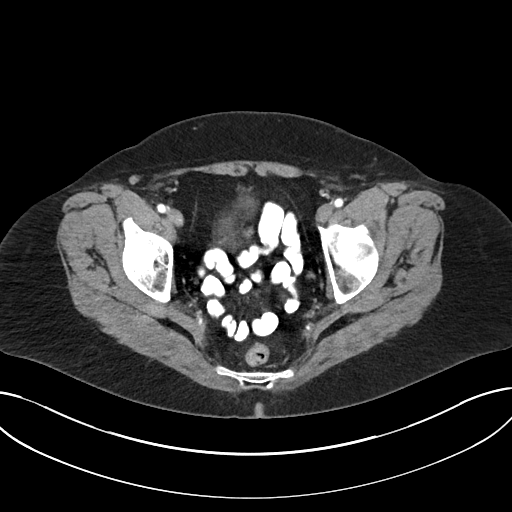
[im 31/101  soft-tissue]
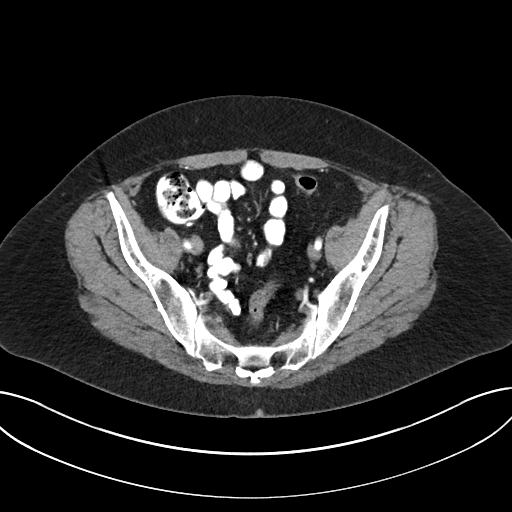
[im 39/101  soft-tissue]
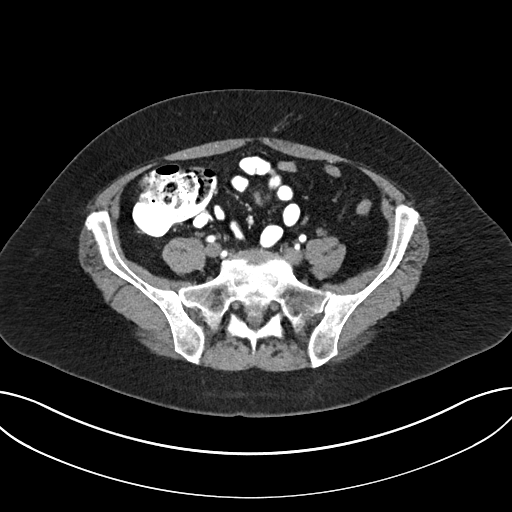
[im 47/101  soft-tissue]
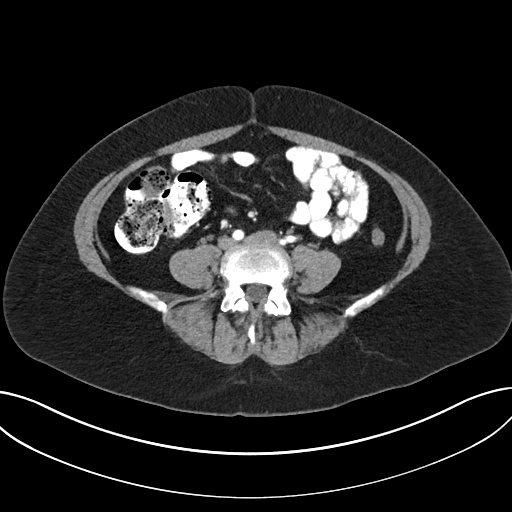
[im 54/101  soft-tissue]
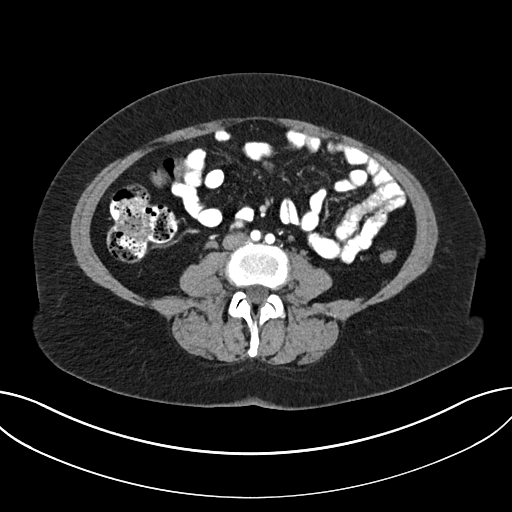
[im 62/101  soft-tissue]
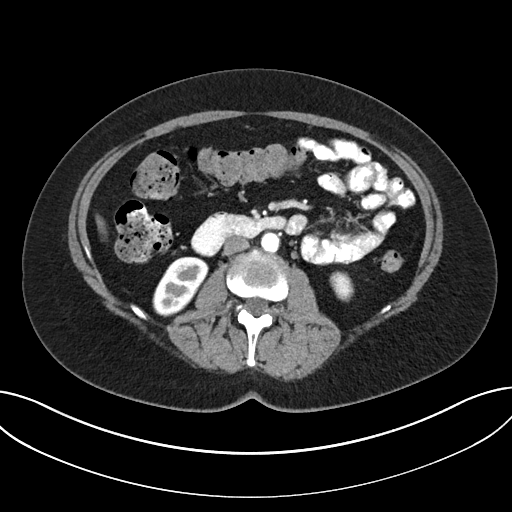
[im 70/101  soft-tissue]
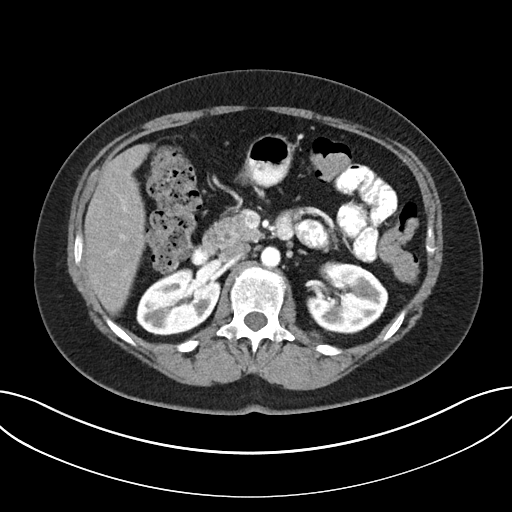
[im 70/101  bone]
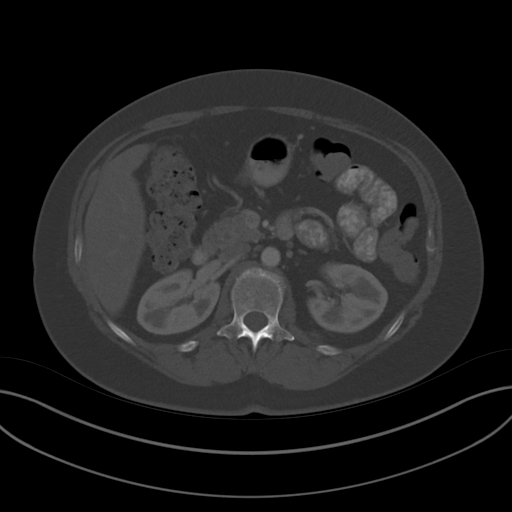
[im 77/101  soft-tissue]
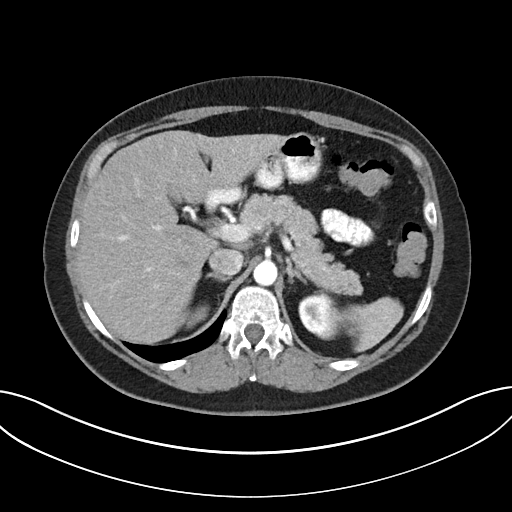
[im 85/101  soft-tissue]
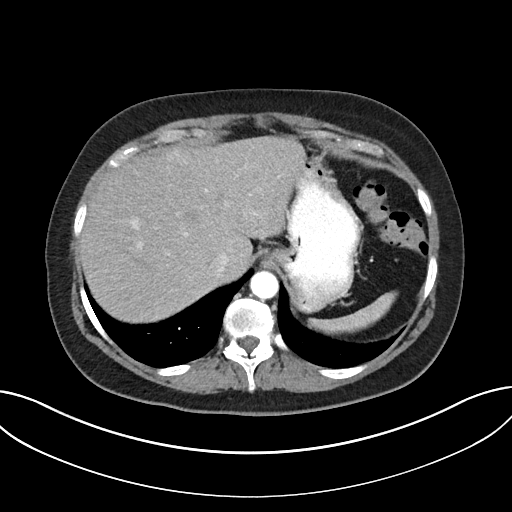
[im 93/101  soft-tissue]
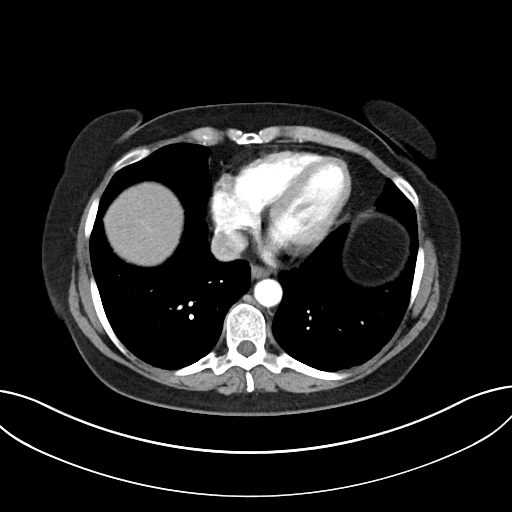

[Series 4: coronals abd pelvis 2.00 cor · coronal · 0.79mm/px · 3 of 141 slices shown]
[im 47/141  soft-tissue]
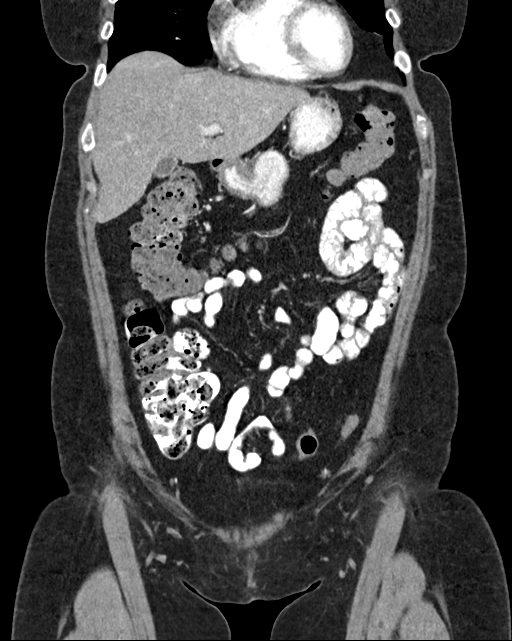
[im 63/141  soft-tissue]
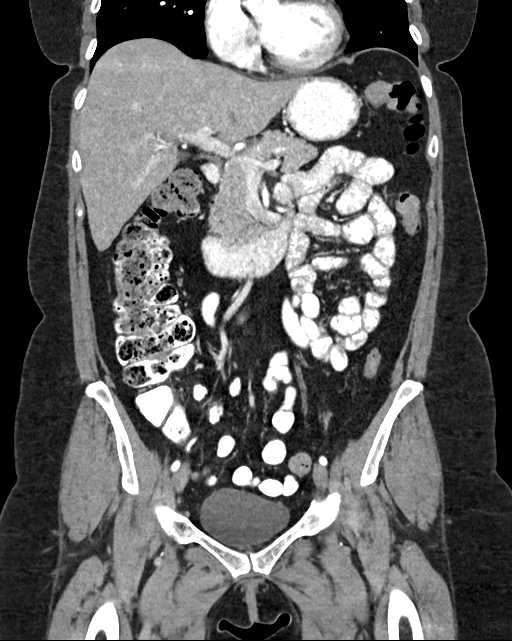
[im 78/141  soft-tissue]
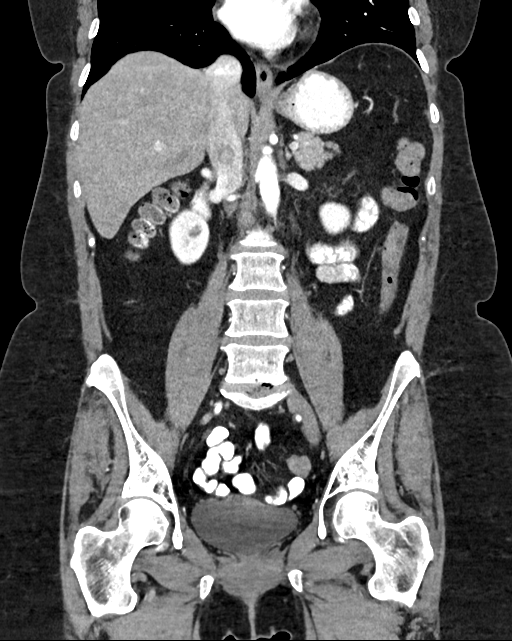

[15 of 46 positions shown; findings below may reference images not displayed]

RADIATION DOSE REDUCTION: This exam was performed according to the
departmental dose-optimization program which includes automated
exposure control, adjustment of the mA and/or kV according to
patient size and/or use of iterative reconstruction technique.

CONTRAST:  100mL OMNIPAQUE IOHEXOL 300 MG/ML  SOLN
FINDINGS: Lower chest: No acute abnormality.

Hepatobiliary: No focal liver abnormality is seen. The gallbladder
is unremarkable.

Pancreas: Unremarkable. No pancreatic ductal dilatation or
surrounding inflammatory changes.

Spleen: Normal in size without focal abnormality.

Adrenals/Urinary Tract: Adrenal glands are unremarkable. No
hydronephrosis or nephrolithiasis. There is a small left upper pole
renal cyst.

Stomach/Bowel: The stomach is within normal limits. There is no
evidence of bowel obstruction.The appendix is normal.

Vascular/Lymphatic: No significant vascular findings are present. No
enlarged abdominal or pelvic lymph nodes.

Reproductive: Prior hysterectomy.

Other: No abdominal wall hernia or abnormality. No abdominopelvic
ascites.

Musculoskeletal: Mild-to-moderate degenerative disc disease at
L5-S1. No acute osseous abnormality. No suspicious lytic or blastic
lesions.
IMPRESSION: No acute findings in the abdomen or pelvis.

## 2023-06-14 ENCOUNTER — Other Ambulatory Visit: Payer: Self-pay | Admitting: Family Medicine

## 2023-06-14 DIAGNOSIS — Z1231 Encounter for screening mammogram for malignant neoplasm of breast: Secondary | ICD-10-CM

## 2023-08-04 ENCOUNTER — Ambulatory Visit
Admission: RE | Admit: 2023-08-04 | Discharge: 2023-08-04 | Disposition: A | Discharge status: Home / Self Care | Payer: No Typology Code available for payment source | Source: Ambulatory Visit | Attending: Family Medicine | Admitting: Family Medicine

## 2023-08-04 DIAGNOSIS — Z1231 Encounter for screening mammogram for malignant neoplasm of breast: Secondary | ICD-10-CM | POA: Insufficient documentation

## 2023-11-18 ENCOUNTER — Ambulatory Visit
Admission: EM | Admit: 2023-11-18 | Discharge: 2023-11-18 | Disposition: A | Discharge status: Home / Self Care | Attending: Emergency Medicine | Admitting: Emergency Medicine

## 2023-11-18 DIAGNOSIS — R051 Acute cough: Secondary | ICD-10-CM | POA: Diagnosis not present

## 2023-11-18 DIAGNOSIS — J069 Acute upper respiratory infection, unspecified: Secondary | ICD-10-CM | POA: Diagnosis not present

## 2023-11-18 MED ORDER — PROMETHAZINE-DM 6.25-15 MG/5ML PO SYRP: 5.0000 mL | ORAL_SOLUTION | Freq: Every evening | ORAL | 0 refills | Status: AC | PRN

## 2023-11-18 MED ORDER — BENZONATATE 100 MG PO CAPS: 100.0000 mg | ORAL_CAPSULE | Freq: Three times a day (TID) | ORAL | 0 refills | Status: AC | PRN

## 2023-11-18 MED ORDER — AZITHROMYCIN 250 MG PO TABS: 250.0000 mg | ORAL_TABLET | Freq: Every day | ORAL | 0 refills | Status: AC

## 2023-11-18 NOTE — Discharge Instructions (Addendum)
 Take the Zithromax  as directed.  Take the Tessalon Perles or Promethazine  DM as directed for cough.  Do not drive, operate machinery, drink alcohol, or perform dangerous activities while taking promethazine  as it may cause drowsiness.    Follow-up with your primary care provider if your symptoms are not improving.

## 2023-11-18 NOTE — ED Triage Notes (Signed)
 Patient to Urgent Care with complaints of dry cough x7 days. Productive in the morning. Denies any fevers.  Taking dayquil and nyquil.

## 2023-11-18 NOTE — ED Provider Notes (Signed)
 [facility]    CSN: [id number] Arrival date & time: 11/18/23  1859      History   Chief Complaint Chief Complaint  Patient presents with   Cough    HPI Kristie Hall is a 63 y.o. female.  Patient presents with 1 week history of congestion and cough.  Her cough is productive in the mornings.  No fever, chills, shortness of breath.  She has been taking DayQuil and NyQuil.  The history is provided by the patient and medical records.    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   Lumbar facet joint pain 09/19/2015    Past Surgical History:  Procedure Laterality Date   ABDOMINAL HYSTERECTOMY  2008   COLONOSCOPY WITH PROPOFOL  N/A 07/14/2021   Procedure: COLONOSCOPY WITH PROPOFOL ;  Surgeon: Luke Salaam, MD;  Location: [facility] ENDOSCOPY;  Service: Gastroenterology;  Laterality: N/A;   DILATION AND CURETTAGE OF UTERUS     endrometrial surgery     FINGER SURGERY     11/2015 neg   LAPAROSCOPY  2007   SHOULDER ARTHROSCOPY W/ ROTATOR CUFF REPAIR Right    TUBAL LIGATION  1991    OB History     Gravida  3   Para  2   Term  2   Preterm      AB  1   Living  2      SAB  1   IAB      Ectopic      Multiple      Live Births  2            Home Medications    Prior to Admission medications   Medication Sig Start Date End Date Taking? Authorizing Provider  azithromycin  (ZITHROMAX ) 250 MG tablet Take 1 tablet (250 mg total) by mouth daily. Take first 2 tablets together, then 1 every day until finished. 11/18/23  Yes Wellington Half, NP  benzonatate (TESSALON) 100 MG capsule Take 1 capsule (100 mg total) by mouth 3 (three) times daily as needed for cough.  11/18/23  Yes Wellington Half, NP  promethazine -dextromethorphan (PROMETHAZINE -DM) 6.25-15 MG/5ML syrup Take 5 mLs by mouth at bedtime as needed. 11/18/23  Yes Wellington Half, NP  ketoprofen  (ORUDIS ) 50 MG capsule Take 1 capsule (50 mg total) by mouth 4 (four) times daily as needed. Patient not taking: Reported on 11/18/2023 07/29/20   Alise Appl, CNM  rizatriptan  (MAXALT ) 5 MG tablet TAKE 1 TABLET BY MOUTH AS NEEDED FOR MIGRAINE. MAY REPEAT IN 2 HOURS IF NEEDED 09/22/20   Alise Appl, CNM  topiramate  (TOPAMAX ) 25 MG tablet TAKE 1 TABLET (25 MG TOTAL) BY MOUTH DAILY. 01/04/22   Alise Appl, CNM    Family History Family History  Problem Relation Age of Onset   Diabetes Father    Heart disease Father    Breast cancer Sister 89   Ovarian cancer Neg Hx    Colon cancer Neg Hx     Social History Social History   Tobacco Use   Smoking status: Never   Smokeless tobacco: Never  Vaping Use   Vaping status:  Never Used  Substance Use Topics   Alcohol use: Yes    Comment: occas   Drug use: No     Allergies   Imitrex [sumatriptan]   Review of Systems Review of Systems  Constitutional:  Negative for chills and fever.  HENT:  Positive for congestion. Negative for ear pain and sore throat.   Respiratory:  Positive for cough. Negative for shortness of breath.      Physical Exam Triage Vital Signs ED Triage Vitals  Encounter Vitals Group     BP      Girls Systolic BP Percentile      Girls Diastolic BP Percentile      Boys Systolic BP Percentile      Boys Diastolic BP Percentile      Pulse      Resp      Temp      Temp src      SpO2      Weight      Height      Head Circumference      Peak Flow      Pain Score      Pain Loc      Pain Education      Exclude from Growth Chart    No data found.  Updated Vital Signs BP (!) 103/57   Pulse 89   Temp (!) 97.5 F (36.4 C)   Resp 16   SpO2 98%   Visual Acuity Right Eye Distance:   Left Eye Distance:   Bilateral  Distance:    Right Eye Near:   Left Eye Near:    Bilateral Near:     Physical Exam Constitutional:      General: She is not in acute distress. HENT:     Right Ear: Tympanic membrane normal.     Left Ear: Tympanic membrane normal.     Nose: Nose normal.     Mouth/Throat:     Mouth: Mucous membranes are moist.     Pharynx: Oropharynx is clear.     Comments: PND  Cardiovascular:     Rate and Rhythm: Normal rate and regular rhythm.     Heart sounds: Normal heart sounds.  Pulmonary:     Effort: Pulmonary effort is normal. No respiratory distress.     Breath sounds: Normal breath sounds.   Neurological:     Mental Status: She is alert.      UC Treatments / Results  Labs (all labs ordered are listed, but only abnormal results are displayed) Labs Reviewed - No data to display  EKG   Radiology No results found.  Procedures Procedures (including critical care time)  Medications Ordered in UC Medications - No data to display  Initial Impression / Assessment and Plan / UC Course  I have reviewed the triage vital signs and the nursing notes.  Pertinent labs & imaging results that were available during my care of the patient were reviewed by me and considered in my medical decision making (see chart for details).    Acute upper respiratory infection, cough.  Afebrile and vital signs are stable.  Lungs are clear and O2 sat is 98% on room air.  Patient has been symptomatic for a week and is not improving with OTC treatment.  Treating today with Zithromax  as she reports this has worked well for her in the past.  Treating cough with Tessalon Perles or Promethazine  DM.  Precautions for drowsiness with promethazine  discussed.  Education provided on URI  and cough.  Instructed her to follow-up with her PCP if she is not improving.  She agrees to plan of care.  Final Clinical Impressions(s) / UC Diagnoses   Final diagnoses:  Acute upper respiratory infection  Acute cough      Discharge Instructions      Take the Zithromax  as directed.  Take the Tessalon Perles or Promethazine  DM as directed for cough.  Do not drive, operate machinery, drink alcohol, or perform dangerous activities while taking promethazine  as it may cause drowsiness.    Follow-up with your primary care provider if your symptoms are not improving.      ED Prescriptions     Medication Sig Dispense Auth. Provider   azithromycin  (ZITHROMAX ) 250 MG tablet Take 1 tablet (250 mg total) by mouth daily. Take first 2 tablets together, then 1 every day until finished. 6 tablet Wellington Half, NP   benzonatate (TESSALON) 100 MG capsule Take 1 capsule (100 mg total) by mouth 3 (three) times daily as needed for cough. 21 capsule Wellington Half, NP   promethazine -dextromethorphan (PROMETHAZINE -DM) 6.25-15 MG/5ML syrup Take 5 mLs by mouth at bedtime as needed. 118 mL Wellington Half, NP      PDMP not reviewed this encounter.   Wellington Half, NP 11/18/23 1925

## 2024-06-13 ENCOUNTER — Other Ambulatory Visit: Payer: Self-pay | Admitting: Family Medicine

## 2024-06-13 DIAGNOSIS — Z1231 Encounter for screening mammogram for malignant neoplasm of breast: Secondary | ICD-10-CM

## 2024-08-07 ENCOUNTER — Encounter
